# Patient Record
Sex: Female | Born: 1965 | Race: White | Hispanic: No | Marital: Single | Smoking: Never smoker
Health system: Southern US, Community
[De-identification: ages and names within clinical notes are randomized; demographics above are authoritative.]

## PROBLEM LIST (undated history)

## (undated) ENCOUNTER — Ambulatory Visit: Admission: EM | Payer: 59 | Source: Home / Self Care

## (undated) DIAGNOSIS — F419 Anxiety disorder, unspecified: Secondary | ICD-10-CM

## (undated) DIAGNOSIS — F32A Depression, unspecified: Secondary | ICD-10-CM

## (undated) DIAGNOSIS — E079 Disorder of thyroid, unspecified: Secondary | ICD-10-CM

## (undated) DIAGNOSIS — I10 Essential (primary) hypertension: Secondary | ICD-10-CM

## (undated) HISTORY — PX: CHOLECYSTECTOMY: SHX55

## (undated) HISTORY — PX: ABDOMINAL HYSTERECTOMY: SHX81

---

## 2020-01-19 ENCOUNTER — Other Ambulatory Visit: Payer: Self-pay

## 2020-01-19 ENCOUNTER — Encounter: Payer: Self-pay | Admitting: Emergency Medicine

## 2020-01-19 ENCOUNTER — Ambulatory Visit
Admission: EM | Admit: 2020-01-19 | Discharge: 2020-01-19 | Disposition: A | Discharge status: Home / Self Care | Payer: BLUE CROSS/BLUE SHIELD | Attending: Physician Assistant | Admitting: Physician Assistant

## 2020-01-19 DIAGNOSIS — R519 Headache, unspecified: Secondary | ICD-10-CM | POA: Diagnosis not present

## 2020-01-19 DIAGNOSIS — Z79899 Other long term (current) drug therapy: Secondary | ICD-10-CM | POA: Diagnosis not present

## 2020-01-19 DIAGNOSIS — I10 Essential (primary) hypertension: Secondary | ICD-10-CM | POA: Diagnosis not present

## 2020-01-19 DIAGNOSIS — Z20822 Contact with and (suspected) exposure to covid-19: Secondary | ICD-10-CM | POA: Diagnosis not present

## 2020-01-19 DIAGNOSIS — R059 Cough, unspecified: Secondary | ICD-10-CM | POA: Diagnosis present

## 2020-01-19 DIAGNOSIS — Z7901 Long term (current) use of anticoagulants: Secondary | ICD-10-CM | POA: Insufficient documentation

## 2020-01-19 DIAGNOSIS — R0981 Nasal congestion: Secondary | ICD-10-CM | POA: Insufficient documentation

## 2020-01-19 DIAGNOSIS — B349 Viral infection, unspecified: Secondary | ICD-10-CM | POA: Insufficient documentation

## 2020-01-19 DIAGNOSIS — E079 Disorder of thyroid, unspecified: Secondary | ICD-10-CM | POA: Insufficient documentation

## 2020-01-19 HISTORY — DX: Essential (primary) hypertension: I10

## 2020-01-19 HISTORY — DX: Disorder of thyroid, unspecified: E07.9

## 2020-01-19 LAB — SARS CORONAVIRUS 2 (TAT 6-24 HRS): SARS Coronavirus 2: NEGATIVE

## 2020-01-19 MED ORDER — FLUTICASONE PROPIONATE 50 MCG/ACT NA SUSP: 1.0000 | Freq: Two times a day (BID) | NASAL | 0 refills | Status: DC

## 2020-01-19 MED ORDER — PSEUDOEPH-BROMPHEN-DM 30-2-10 MG/5ML PO SYRP: 5.0000 mL | ORAL_SOLUTION | Freq: Four times a day (QID) | ORAL | 0 refills | Status: AC | PRN

## 2020-01-19 NOTE — Discharge Instructions (Addendum)

## 2020-01-19 NOTE — ED Provider Notes (Signed)
[facility]    CSN: [id number] Arrival date & time: 01/19/20  4235      History   Chief Complaint Chief Complaint  Patient presents with  . Cough    HPI Linda Barnes is a 54 y.o. female presents for 3 day history of nasal drainage, sinus pressure and cough.  She says it feels like there has been a "pop off."  Patient admits to headaches.  She says the cough is usually dry.  Denies any associated fever, fatigue, body aches, chest pain or breathing difficulty.  No known Covid exposure.  Fully vaccinated for COVID 19.  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  No history of cardiopulmonary disease.  She says that she has not tried any over-the-counter medications for symptoms.  No other concerns.  HPI  Past Medical History:  Diagnosis Date  . Hypertension   . Thyroid disease     There are no problems to display for this patient.   Past Surgical History:  Procedure Laterality Date  . ABDOMINAL HYSTERECTOMY    . CHOLECYSTECTOMY      OB History   No obstetric history on file.      Home Medications    Prior to Admission medications   Medication Sig Start Date End Date Taking? Authorizing Provider  buPROPion (ZYBAN) 150 MG 12 hr tablet Take by mouth. 05/14/18  Yes [provider]  citalopram (CELEXA) 20 MG tablet Take by mouth. 05/26/18  Yes [provider]  gabapentin (NEURONTIN) 300 MG capsule Take by mouth. 01/24/19  Yes [provider]  brompheniramine-pseudoephedrine-DM 30-2-10 MG/5ML syrup Take 5 mLs by mouth 4 (four) times daily as needed for up to 7 days. 01/19/20 01/26/20  Eusebio Friendly B, PA-C  fluticasone (FLONASE) 50 MCG/ACT nasal spray Place 1 spray into both nostrils 2 (two) times daily for 7 days. 01/19/20 01/26/20  Eusebio Friendly B, PA-C  hydrochlorothiazide (HYDRODIURIL) 25 MG tablet Take by mouth.    [provider]  Levothyroxine Sodium 75 MCG  CAPS Take by mouth.    [provider]  losartan (COZAAR) 100 MG tablet Take by mouth.    [provider]  montelukast (SINGULAIR) 10 MG tablet Take 1 tablet by mouth daily.    [provider]  omeprazole (PRILOSEC) 40 MG capsule Take by mouth.    [provider]    Family History Family History  Problem Relation Age of Onset  . Cancer Mother     Social History Social History   Tobacco Use  . Smoking status: Never Smoker  . Smokeless tobacco: Never Used  Vaping Use  . Vaping Use: Never assessed  Substance Use Topics  . Alcohol use: Not Currently  . Drug use: Not Currently     Allergies   Nsaids   Review of Systems Review of Systems  Constitutional: Negative for chills, diaphoresis, fatigue and fever.  HENT: Positive for congestion, postnasal drip, rhinorrhea, sinus pressure and sore throat. Negative for ear pain and sinus pain.   Respiratory: Positive for cough. Negative for shortness of breath.   Gastrointestinal: Negative for abdominal pain, nausea and vomiting.  Musculoskeletal: Negative for arthralgias and myalgias.  Skin: Negative for rash.  Neurological: Positive for headaches. Negative for weakness.  Hematological: Negative for adenopathy.     Physical Exam Triage Vital Signs ED Triage Vitals  Enc Vitals Group     BP 01/19/20 0934 131/73  Pulse Rate 01/19/20 0934 65     Resp 01/19/20 0934 18     Temp 01/19/20 0934 98.6 F (37 C)     Temp Source 01/19/20 0934 Oral     SpO2 01/19/20 0934 100 %     Weight 01/19/20 0930 165 lb (74.8 kg)     Height 01/19/20 0930 5\' 6"  (1.676 m)     Head Circumference --      Peak Flow --      Pain Score 01/19/20 0930 6     Pain Loc --      Pain Edu? --      Excl. in GC? --    No data found.  Updated Vital Signs BP 131/73 (BP Location: Left Arm)   Pulse 65   Temp 98.6 F (37 C) (Oral)   Resp 18   Ht 5\' 6"  (1.676 m)   Wt 165 lb (74.8 kg)   SpO2 100%   BMI 26.63 kg/m     Physical Exam Vitals and nursing note reviewed.  Constitutional:      General: She is not in acute distress.    Appearance: Normal appearance. She is not ill-appearing or toxic-appearing.  HENT:     Head: Normocephalic and atraumatic.     Right Ear: Tympanic membrane, ear canal and external ear normal.     Left Ear: Tympanic membrane, ear canal and external ear normal.     Nose: Congestion present.     Mouth/Throat:     Mouth: Mucous membranes are moist.     Pharynx: Oropharynx is clear. Posterior oropharyngeal erythema present.  Eyes:     General: No scleral icterus.       Right eye: No discharge.        Left eye: No discharge.     Conjunctiva/sclera: Conjunctivae normal.  Cardiovascular:     Rate and Rhythm: Normal rate and regular rhythm.     Heart sounds: Normal heart sounds.  Pulmonary:     Effort: Pulmonary effort is normal. No respiratory distress.     Breath sounds: Normal breath sounds.  Musculoskeletal:     Cervical back: Neck supple.  Skin:    General: Skin is dry.  Neurological:     General: No focal deficit present.     Mental Status: She is alert. Mental status is at baseline.     Motor: No weakness.     Gait: Gait normal.  Psychiatric:        Mood and Affect: Mood normal.        Behavior: Behavior normal.        Thought Content: Thought content normal.      UC Treatments / Results  Labs (all labs ordered are listed, but only abnormal results are displayed) Labs Reviewed  SARS CORONAVIRUS 2 (TAT 6-24 HRS)    EKG   Radiology No results found.  Procedures Procedures (including critical care time)  Medications Ordered in UC Medications - No data to display  Initial Impression / Assessment and Plan / UC Course  I have reviewed the triage vital signs and the nursing notes.  Pertinent labs & imaging results that were available during my care of the patient were reviewed by me and considered in my medical decision making (see chart for  details).   Covid testing performed.  CDC guidelines, isolation protocol and ED precaution discussed if positive.  Prescribed Bromfed and Flonase for symptoms.  Advise rest and increasing fluids.  Follow-up as needed with  our department for any new or worsening symptoms or if not feeling better over the next week  Final Clinical Impressions(s) / UC Diagnoses   Final diagnoses:  Viral illness  Cough  Sinus congestion     Discharge Instructions     URI/COLD SYMPTOMS: Your exam today is consistent with a viral illness. Antibiotics are not indicated at this time. Use medications as directed, including cough syrup, nasal saline, and decongestants. Your symptoms should improve over the next few days and resolve within 7-10 days. Increase rest and fluids. F/u if symptoms worsen or predominate such as sore throat, ear pain, productive cough, shortness of breath, or if you develop high fevers or worsening fatigue over the next several days.    You have received COVID testing today either for positive exposure, concerning symptoms that could be related to COVID infection, screening purposes, or re-testing after confirmed positive.  Your test obtained today checks for active viral infection in the last 1-2 weeks. If your test is negative now, you can still test positive later. So, if you do develop symptoms you should either get re-tested and/or isolate x 10 days. Please follow CDC guidelines.  While Rapid antigen tests come back in 15-20 minutes, send out PCR/molecular test results typically come back within 24 hours. In the mean time, if you are symptomatic, assume this could be a positive test and treat/monitor yourself as if you do have COVID.   We will call with test results. Please download the MyChart app and set up a profile to access test results.   If symptomatic, go home and rest. Push fluids. Take Tylenol as needed for discomfort. Gargle warm salt water. Throat lozenges. Take Mucinex DM or  Robitussin for cough. Humidifier in bedroom to ease coughing. Warm showers. Also review the COVID handout for more information.  COVID-19 INFECTION: The incubation period of COVID-19 is approximately 14 days after exposure, with most symptoms developing in roughly 4-5 days. Symptoms may range in severity from mild to critically severe. Roughly 80% of those infected will have mild symptoms. People of any age may become infected with COVID-19 and have the ability to transmit the virus. The most common symptoms include: fever, fatigue, cough, body aches, headaches, sore throat, nasal congestion, shortness of breath, nausea, vomiting, diarrhea, changes in smell and/or taste.    COURSE OF ILLNESS Some patients may begin with mild disease which can progress quickly into critical symptoms. If your symptoms are worsening please call ahead to the Emergency Department and proceed there for further treatment. Recovery time appears to be roughly 1-2 weeks for mild symptoms and 3-6 weeks for severe disease.   GO IMMEDIATELY TO ER FOR FEVER YOU ARE UNABLE TO GET DOWN WITH TYLENOL, BREATHING PROBLEMS, CHEST PAIN, FATIGUE, LETHARGY, INABILITY TO EAT OR DRINK, ETC  QUARANTINE AND ISOLATION: To help decrease the spread of COVID-19 please remain isolated if you have COVID infection or are highly suspected to have COVID infection. This means -stay home and isolate to one room in the home if you live with others. Do not share a bed or bathroom with others while ill, sanitize and wipe down all countertops and keep common areas clean and disinfected. You may discontinue isolation if you have a mild case and are asymptomatic 10 days after symptom onset as long as you have been fever free >24 hours without having to take Motrin or Tylenol. If your case is more severe (meaning you develop pneumonia or are admitted in the hospital), you  may have to isolate longer.   If you have been in close contact (within 6 feet) of someone  diagnosed with COVID 19, you are advised to quarantine in your home for 14 days as symptoms can develop anywhere from 2-14 days after exposure to the virus. If you develop symptoms, you  must isolate.  Most current guidelines for COVID after exposure -isolate 10 days if you ARE NOT tested for COVID as long as symptoms do not develop -isolate 7 days if you are tested and remain asymptomatic -You do not necessarily need to be tested for COVID if you have + exposure and        develop   symptoms. Just isolate at home x10 days from symptom onset During this global pandemic, CDC advises to practice social distancing, try to stay at least 10ft away from others at all times. Wear a face covering. Wash and sanitize your hands regularly and avoid going anywhere that is not necessary.  KEEP IN MIND THAT THE COVID TEST IS NOT 100% ACCURATE AND YOU SHOULD STILL DO EVERYTHING TO PREVENT POTENTIAL SPREAD OF VIRUS TO OTHERS (WEAR MASK, WEAR GLOVES, WASH HANDS AND SANITIZE REGULARLY). IF INITIAL TEST IS NEGATIVE, THIS MAY NOT MEAN YOU ARE DEFINITELY NEGATIVE. MOST ACCURATE TESTING IS DONE 5-7 DAYS AFTER EXPOSURE.   It is not advised by CDC to get re-tested after receiving a positive COVID test since you can still test positive for weeks to months after you have already cleared the virus.   *If you have not been vaccinated for COVID, I strongly suggest you consider getting vaccinated as long as there are no contraindications.      ED Prescriptions    Medication Sig Dispense Auth. Provider   brompheniramine-pseudoephedrine-DM 30-2-10 MG/5ML syrup Take 5 mLs by mouth 4 (four) times daily as needed for up to 7 days. 120 mL Eusebio Friendly B, PA-C   fluticasone (FLONASE) 50 MCG/ACT nasal spray Place 1 spray into both nostrils 2 (two) times daily for 7 days. 1 g Shirlee Latch, PA-C     PDMP not reviewed this encounter.   Shirlee Latch, PA-C 01/19/20 1052

## 2020-01-19 NOTE — ED Triage Notes (Signed)
Pt c/o cough, sinus pressure, post nasal drainage, and nasal congestion. Started about 3 days. She has had covid vaccines.

## 2020-05-10 ENCOUNTER — Ambulatory Visit (INDEPENDENT_AMBULATORY_CARE_PROVIDER_SITE_OTHER): Payer: 59

## 2020-05-10 ENCOUNTER — Encounter: Payer: Self-pay | Admitting: Emergency Medicine

## 2020-05-10 ENCOUNTER — Ambulatory Visit
Admission: EM | Admit: 2020-05-10 | Discharge: 2020-05-10 | Disposition: A | Discharge status: Home / Self Care | Payer: 59 | Attending: Physician Assistant | Admitting: Physician Assistant

## 2020-05-10 ENCOUNTER — Other Ambulatory Visit: Payer: Self-pay

## 2020-05-10 DIAGNOSIS — R197 Diarrhea, unspecified: Secondary | ICD-10-CM | POA: Diagnosis present

## 2020-05-10 DIAGNOSIS — R1110 Vomiting, unspecified: Secondary | ICD-10-CM

## 2020-05-10 DIAGNOSIS — R059 Cough, unspecified: Secondary | ICD-10-CM | POA: Diagnosis present

## 2020-05-10 DIAGNOSIS — E876 Hypokalemia: Secondary | ICD-10-CM | POA: Diagnosis present

## 2020-05-10 DIAGNOSIS — U071 COVID-19: Secondary | ICD-10-CM

## 2020-05-10 DIAGNOSIS — R112 Nausea with vomiting, unspecified: Secondary | ICD-10-CM | POA: Insufficient documentation

## 2020-05-10 DIAGNOSIS — Z8616 Personal history of COVID-19: Secondary | ICD-10-CM | POA: Diagnosis not present

## 2020-05-10 HISTORY — DX: Anxiety disorder, unspecified: F41.9

## 2020-05-10 HISTORY — DX: Depression, unspecified: F32.A

## 2020-05-10 LAB — URINALYSIS, COMPLETE (UACMP) WITH MICROSCOPIC
Glucose, UA: NEGATIVE mg/dL
Leukocytes,Ua: NEGATIVE
Nitrite: NEGATIVE
Specific Gravity, Urine: 1.02 (ref 1.005–1.030)
pH: 6 (ref 5.0–8.0)

## 2020-05-10 LAB — CBC WITH DIFFERENTIAL/PLATELET
Abs Immature Granulocytes: 0.02 10*3/uL (ref 0.00–0.07)
Basophils Absolute: 0 10*3/uL (ref 0.0–0.1)
Basophils Relative: 0 %
Eosinophils Absolute: 0 10*3/uL (ref 0.0–0.5)
Eosinophils Relative: 1 %
HCT: 37.2 % (ref 36.0–46.0)
Hemoglobin: 13 g/dL (ref 12.0–15.0)
Immature Granulocytes: 0 %
Lymphocytes Relative: 35 %
Lymphs Abs: 1.6 10*3/uL (ref 0.7–4.0)
MCH: 29.2 pg (ref 26.0–34.0)
MCHC: 34.9 g/dL (ref 30.0–36.0)
MCV: 83.6 fL (ref 80.0–100.0)
Monocytes Absolute: 0.3 10*3/uL (ref 0.1–1.0)
Monocytes Relative: 8 %
Neutro Abs: 2.5 10*3/uL (ref 1.7–7.7)
Neutrophils Relative %: 56 %
Platelets: 266 10*3/uL (ref 150–400)
RBC: 4.45 MIL/uL (ref 3.87–5.11)
RDW: 12.6 % (ref 11.5–15.5)
WBC: 4.5 10*3/uL (ref 4.0–10.5)
nRBC: 0 % (ref 0.0–0.2)

## 2020-05-10 LAB — COMPREHENSIVE METABOLIC PANEL
ALT: 19 U/L (ref 0–44)
AST: 23 U/L (ref 15–41)
Albumin: 4 g/dL (ref 3.5–5.0)
Alkaline Phosphatase: 64 U/L (ref 38–126)
Anion gap: 7 (ref 5–15)
BUN: 14 mg/dL (ref 6–20)
CO2: 29 mmol/L (ref 22–32)
Calcium: 8.6 mg/dL — ABNORMAL LOW (ref 8.9–10.3)
Chloride: 97 mmol/L — ABNORMAL LOW (ref 98–111)
Creatinine, Ser: 1.11 mg/dL — ABNORMAL HIGH (ref 0.44–1.00)
GFR, Estimated: 59 mL/min — ABNORMAL LOW (ref >60)
Glucose, Bld: 93 mg/dL (ref 70–99)
Potassium: 2.7 mmol/L — CL (ref 3.5–5.1)
Sodium: 133 mmol/L — ABNORMAL LOW (ref 135–145)
Total Bilirubin: 0.9 mg/dL (ref 0.3–1.2)
Total Protein: 7.2 g/dL (ref 6.5–8.1)

## 2020-05-10 LAB — LIPASE, BLOOD: Lipase: 33 U/L (ref 11–51)

## 2020-05-10 NOTE — ED Notes (Signed)
Patient is being discharged from the Urgent Care and sent to the Emergency Department via POV . Per Eusebio Friendly, PA, patient is in need of higher level of care due to hypokalemia. Patient is aware and verbalizes understanding of plan of care.  Vitals:   05/10/20 0912  BP: 134/75  Pulse: 74  Resp: 18  Temp: 98.1 F (36.7 C)  SpO2: 100%

## 2020-05-10 NOTE — ED Triage Notes (Signed)
Patient in today c/o emesis and diarrhea x 1 day. Patient tested positive for covid 04/29/20. Patient felt feverish yesterday, but didn't take her temperature. Patient has not taken any OTC medications.

## 2020-05-10 NOTE — ED Provider Notes (Signed)
[facility]    CSN: [id number] Arrival date & time: 05/10/20  0900      History   Chief Complaint Chief Complaint  Patient presents with  . Emesis  . Diarrhea    HPI Linda Barnes is a 55 y.o. female presenting for nausea/vomiting and diarrhea since yesterday. She did test positive for COVID 19 on 04/28/2020 at [facility]. She complained for 3 day history of cough and congestion at that time.  Patient states that she was treated for possible pneumonia with Augmentin and prednisone and finish the course of medications recently.  She says that she has had a continued cough but it has improved.  Denies any pain in her chest or breathing difficulty.  Admits to mild sore throat and runny nose as well.  She says that the nausea and vomiting as well as diarrhea symptoms are new over the past day.  She says she has had diarrhea every hour and states that she has vomited anytime she is try to eat or drink anything.  Patient also admits to abdominal cramping throughout which she says is moderate.  Denies any urinary symptoms including painful urination or frequency.  No bloody urine.  No black stools or blood in the stools.  Patient's past medical history significant for hypertension and thyroid problems.  She does have a surgical past history of abdominal hysterectomy and cholecystectomy.  Patient not taking any over-the-counter medication for symptoms. 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  Patient has no other complaints or concerns.  HPI  Past Medical History:  Diagnosis Date  . Anxiety   . Depression   . Hypertension   . Thyroid disease     There are no problems to display for this patient.   Past Surgical History:  Procedure Laterality Date  . ABDOMINAL HYSTERECTOMY    . CHOLECYSTECTOMY      OB History   No obstetric history on file.      Home  Medications    Prior to Admission medications   Medication Sig Start Date End Date Taking? Authorizing Provider  buPROPion (WELLBUTRIN XL) 300 MG 24 hr tablet Take 300 mg by mouth daily. 02/11/20  Yes [provider]  citalopram (CELEXA) 20 MG tablet Take by mouth. 05/26/18  Yes [provider]  fluticasone (FLONASE) 50 MCG/ACT nasal spray Place 1 spray into both nostrils 2 (two) times daily for 7 days. 01/19/20 01/26/20 Yes Eusebio Friendly B, PA-C  gabapentin (NEURONTIN) 300 MG capsule Take by mouth. 01/24/19  Yes [provider]  hydrochlorothiazide (HYDRODIURIL) 25 MG tablet Take by mouth.   Yes [provider]  Levothyroxine Sodium 75 MCG CAPS Take by mouth.   Yes [provider]  losartan (COZAAR) 100 MG tablet Take by mouth.   Yes [provider]  montelukast (SINGULAIR) 10 MG tablet Take 1 tablet by mouth daily.   Yes [provider]  omeprazole (PRILOSEC) 40 MG capsule Take by mouth.   Yes [provider]    Family History Family History  Problem Relation Age of Onset  . Cancer Mother   . Hypertension Mother   . Thyroid disease Mother   . Pulmonary fibrosis Father   . Hypertension Father   . Atrial fibrillation Father     Social History Social History   Tobacco Use  . Smoking status: Never Smoker  . Smokeless  tobacco: Never Used  Vaping Use  . Vaping Use: Never used  Substance Use Topics  . Alcohol use: Never  . Drug use: Not Currently     Allergies   Nsaids and Other   Review of Systems Review of Systems  Constitutional: Positive for fatigue. Negative for chills, diaphoresis and fever.  HENT: Positive for congestion and sore throat. Negative for ear pain, rhinorrhea, sinus pressure and sinus pain.   Respiratory: Positive for cough. Negative for shortness of breath and wheezing.   Cardiovascular: Negative for chest pain.  Gastrointestinal: Positive for abdominal pain, diarrhea, nausea and  vomiting.  Musculoskeletal: Positive for myalgias. Negative for arthralgias.  Skin: Negative for rash.  Neurological: Negative for weakness and headaches.  Hematological: Negative for adenopathy.     Physical Exam Triage Vital Signs ED Triage Vitals  Enc Vitals Group     BP      Pulse      Resp      Temp      Temp src      SpO2      Weight      Height      Head Circumference      Peak Flow      Pain Score      Pain Loc      Pain Edu?      Excl. in GC?    No data found.  Updated Vital Signs BP 134/75 (BP Location: Left Arm)   Pulse 74   Temp 98.1 F (36.7 C) (Oral)   Resp 18   Ht 5\' 4"  (1.626 m)   Wt 165 lb (74.8 kg)   SpO2 100%   BMI 28.32 kg/m       Physical Exam Vitals and nursing note reviewed.  Constitutional:      General: She is not in acute distress.    Appearance: Normal appearance. She is not ill-appearing or toxic-appearing.  HENT:     Head: Normocephalic and atraumatic.     Nose: Congestion and rhinorrhea (trace clear drainage) present.     Mouth/Throat:     Mouth: Mucous membranes are moist.     Pharynx: Oropharynx is clear.  Eyes:     General: No scleral icterus.       Right eye: No discharge.        Left eye: No discharge.     Conjunctiva/sclera: Conjunctivae normal.  Cardiovascular:     Rate and Rhythm: Normal rate and regular rhythm.     Heart sounds: Normal heart sounds.  Pulmonary:     Effort: Pulmonary effort is normal. No respiratory distress.     Breath sounds: Normal breath sounds.  Abdominal:     Palpations: Abdomen is soft.     Tenderness: There is generalized abdominal tenderness. There is no right CVA tenderness, left CVA tenderness, guarding or rebound.  Musculoskeletal:     Cervical back: Neck supple.  Skin:    General: Skin is dry.  Neurological:     General: No focal deficit present.     Mental Status: She is alert. Mental status is at baseline.     Motor: No weakness.     Gait: Gait normal.  Psychiatric:         Mood and Affect: Mood normal.        Behavior: Behavior normal.        Thought Content: Thought content normal.      UC Treatments / Results  Labs (all labs ordered  are listed, but only abnormal results are displayed) Labs Reviewed  CBC WITH DIFFERENTIAL/PLATELET  COMPREHENSIVE METABOLIC PANEL  URINALYSIS, COMPLETE (UACMP) WITH MICROSCOPIC  LIPASE, BLOOD    EKG   Radiology DG Chest 2 View  Result Date: 05/10/2020 CLINICAL DATA:  Cough, vomiting.  COVID 2 weeks ago. EXAM: CHEST - 2 VIEW COMPARISON:  None. FINDINGS: The heart size and mediastinal contours are within normal limits. Both lungs are clear. No visible pleural effusions or pneumothorax. No acute osseous abnormality. Cholecystectomy clips. IMPRESSION: No active cardiopulmonary disease. Electronically Signed   By: Feliberto Harts MD   On: 05/10/2020 10:25    Procedures Procedures (including critical care time)  Medications Ordered in UC Medications - No data to display  Initial Impression / Assessment and Plan / UC Course  I have reviewed the triage vital signs and the nursing notes.  Pertinent labs & imaging results that were available during my care of the patient were reviewed by me and considered in my medical decision making (see chart for details).    55 y/o female with positive COVID test 12 days ago and symptom start 15 days ago presents for onset of n/v/d yesterday and continued cough.   In the clinic all VSS and patient in NAD.  On patient exam she does have mild clear nasal drainage and diffuse abdominal tenderness to palpation.  The rest the exam is within normal limits including that her chest is clear to auscultation heart regular rate and rhythm.  Due to continued symptoms of cough, obtaining chest x-ray at this time.  Due to complaint of abdominal pain diffusely containing CBC, CMP, lipase and UA.  CXR independently reviewed by me and overread confirms normal report.  No evidence of acute  cardiopulmonary disease or abnormality.  CBC normal. CMP significant for CRITICALLY LOW POTASSIUM AT 2.7.  Sodium slightly low 133, creatinine elevated at 1.11 and GFR decreased to 59.  Urinalysis shows trace blood, trace ketones and trace protein.  Discussed results with patient and advised following up in the ED at this time for likely IV fluids and potassium replacement.   Patient has agreed to go to [facility] ED in [facility] for hypokalemia.  Final Clinical Impressions(s) / UC Diagnoses   Final diagnoses:  Hypokalemia  Nausea vomiting and diarrhea  Personal history of COVID-19  Cough     Discharge Instructions     Your labs are significant for critically low potassium. This is likely due to losses from vomiting and diarrhea. The potassium being this low is a medical emergency so you should go to the ED at this time for IV fluids and likely potassium replacement.  Chest x-ray is normal.   You have been advised to follow up immediately in the emergency department for concerning signs.symptoms. If you declined EMS transport, please have a family member take you directly to the ED at this time. Do not delay. Based on concerns about condition, if you do not follow up in th e ED, you may risk poor outcomes including worsening of condition, delayed treatment and potentially life threatening issues. If you have declined to go to the ED at this time, you should call your PCP immediately to set up a follow up appointment.  Go to ED for red flag symptoms, including; fevers you cannot reduce with Tylenol/Motrin, severe headaches, vision changes, numbness/weakness in part of the body, lethargy, confusion, intractable vomiting, severe dehydration, chest pain, breathing difficulty, severe persistent abdominal or pelvic pain, signs of severe infection (  increased redness, swelling of an area), feeling faint or passing out, dizziness, etc. You should especially go to the ED for sudden acute worsening of  condition if you do not elect to go at this time.     ED Prescriptions    None     PDMP not reviewed this encounter.   Shirlee Latch, PA-C 05/10/20 1125

## 2020-05-10 NOTE — Discharge Instructions (Addendum)
Your labs are significant for critically low potassium. This is likely due to losses from vomiting and diarrhea. The potassium being this low is a medical emergency so you should go to the ED at this time for IV fluids and likely potassium replacement.  Chest x-ray is normal.   You have been advised to follow up immediately in the emergency department for concerning signs.symptoms. If you declined EMS transport, please have a family member take you directly to the ED at this time. Do not delay. Based on concerns about condition, if you do not follow up in th e ED, you may risk poor outcomes including worsening of condition, delayed treatment and potentially life threatening issues. If you have declined to go to the ED at this time, you should call your PCP immediately to set up a follow up appointment.  Go to ED for red flag symptoms, including; fevers you cannot reduce with Tylenol/Motrin, severe headaches, vision changes, numbness/weakness in part of the body, lethargy, confusion, intractable vomiting, severe dehydration, chest pain, breathing difficulty, severe persistent abdominal or pelvic pain, signs of severe infection (increased redness, swelling of an area), feeling faint or passing out, dizziness, etc. You should especially go to the ED for sudden acute worsening of condition if you do not elect to go at this time.

## 2020-07-09 ENCOUNTER — Other Ambulatory Visit: Payer: Self-pay

## 2020-07-09 ENCOUNTER — Ambulatory Visit
Admission: EM | Admit: 2020-07-09 | Discharge: 2020-07-09 | Disposition: A | Discharge status: Home / Self Care | Payer: 59 | Attending: Physician Assistant | Admitting: Physician Assistant

## 2020-07-09 ENCOUNTER — Encounter: Payer: Self-pay | Admitting: Emergency Medicine

## 2020-07-09 DIAGNOSIS — L03012 Cellulitis of left finger: Secondary | ICD-10-CM

## 2020-07-09 DIAGNOSIS — M79645 Pain in left finger(s): Secondary | ICD-10-CM

## 2020-07-09 MED ORDER — MUPIROCIN 2 % EX OINT: TOPICAL_OINTMENT | CUTANEOUS | 0 refills | Status: AC

## 2020-07-09 MED ORDER — DOXYCYCLINE HYCLATE 100 MG PO CAPS: 100.0000 mg | ORAL_CAPSULE | Freq: Two times a day (BID) | ORAL | 0 refills | Status: AC

## 2020-07-09 NOTE — ED Provider Notes (Signed)
[facility]    CSN: [id number] Arrival date & time: 07/09/20  1358      History   Chief Complaint Chief Complaint  Patient presents with  . Hand Pain    Left thumb    HPI Linda Barnes is a 55 y.o. female presenting for approximately 1 week history of pain of the left thumb.  Patient says that about a week ago some pus came out from around her cuticle region.  She says she has had increased swelling and pain since and no improvement in the situation.  She says she has cleaned the finger with hydrogen peroxide and applied Neosporin without relief.  She denies any fevers and has not had any continued pustular drainage.  She says it has bled some.  She never injured the finger.  Denies similar problem the past.  No other concerns or complaints.  HPI  Past Medical History:  Diagnosis Date  . Anxiety   . Depression   . Hypertension   . Thyroid disease     There are no problems to display for this patient.   Past Surgical History:  Procedure Laterality Date  . ABDOMINAL HYSTERECTOMY    . CHOLECYSTECTOMY      OB History   No obstetric history on file.      Home Medications    Prior to Admission medications   Medication Sig Start Date End Date Taking? Authorizing Provider  buPROPion (WELLBUTRIN XL) 300 MG 24 hr tablet Take 300 mg by mouth daily. 02/11/20  Yes [provider]  citalopram (CELEXA) 20 MG tablet Take by mouth. 05/26/18  Yes [provider]  doxycycline (VIBRAMYCIN) 100 MG capsule Take 1 capsule (100 mg total) by mouth 2 (two) times daily for 7 days. 07/09/20 07/16/20 Yes Eusebio Friendly B, PA-C  fluticasone (FLONASE) 50 MCG/ACT nasal spray Place 1 spray into both nostrils 2 (two) times daily for 7 days. 01/19/20 01/26/20 Yes Eusebio Friendly B, PA-C  gabapentin (NEURONTIN) 300 MG capsule Take by mouth. 01/24/19  Yes [provider]  hydrochlorothiazide (HYDRODIURIL) 25 MG tablet Take by mouth.   Yes [provider]   Levothyroxine Sodium 75 MCG CAPS Take by mouth.   Yes [provider]  losartan (COZAAR) 100 MG tablet Take by mouth.   Yes [provider]  montelukast (SINGULAIR) 10 MG tablet Take 1 tablet by mouth daily.   Yes [provider]  mupirocin ointment (BACTROBAN) 2 % Apply to left thumb twice daily 07/09/20 07/16/20 Yes Shirlee Latch, PA-C  omeprazole (PRILOSEC) 40 MG capsule Take by mouth.   Yes [provider]    Family History Family History  Problem Relation Age of Onset  . Cancer Mother   . Hypertension Mother   . Thyroid disease Mother   . Pulmonary fibrosis Father   . Hypertension Father   . Atrial fibrillation Father     Social History Social History   Tobacco Use  . Smoking status: Never Smoker  . Smokeless tobacco: Never Used  Vaping Use  . Vaping Use: Never used  Substance Use Topics  . Alcohol use: Never  . Drug use: Not Currently     Allergies   Nsaids and Other   Review of Systems Review of Systems  Constitutional: Negative for fatigue and fever.  Musculoskeletal: Positive for joint swelling. Negative for arthralgias.  Skin: Positive for color change and wound.  Neurological: Negative for weakness and numbness.     Physical Exam Triage Vital  Signs ED Triage Vitals  Enc Vitals Group     BP 07/09/20 1414 (!) 137/95     Pulse Rate 07/09/20 1414 74     Resp 07/09/20 1414 18     Temp 07/09/20 1414 98.4 F (36.9 C)     Temp Source 07/09/20 1414 Oral     SpO2 07/09/20 1414 100 %     Weight 07/09/20 1412 164 lb 14.5 oz (74.8 kg)     Height 07/09/20 1412 5\' 4"  (1.626 m)     Head Circumference --      Peak Flow --      Pain Score 07/09/20 1412 6     Pain Loc --      Pain Edu? --      Excl. in GC? --    No data found.  Updated Vital Signs BP (!) 137/95 (BP Location: Left Arm)   Pulse 74   Temp 98.4 F (36.9 C) (Oral)   Resp 18   Ht 5\' 4"  (1.626 m)   Wt 164 lb 14.5 oz (74.8 kg)   SpO2 100%   BMI 28.31  kg/m      Physical Exam Vitals and nursing note reviewed.  Constitutional:      General: She is not in acute distress.    Appearance: Normal appearance. She is not ill-appearing or toxic-appearing.  HENT:     Head: Normocephalic and atraumatic.     Mouth/Throat:     Pharynx: Oropharynx is clear.  Eyes:     General: No scleral icterus.       Right eye: No discharge.        Left eye: No discharge.     Conjunctiva/sclera: Conjunctivae normal.  Cardiovascular:     Rate and Rhythm: Normal rate and regular rhythm.     Pulses: Normal pulses.     Heart sounds: Normal heart sounds.  Pulmonary:     Effort: Pulmonary effort is normal. No respiratory distress.  Musculoskeletal:     Cervical back: Neck supple.  Skin:    General: Skin is dry.     Comments: Left thumb: There is moderate swelling and erythema of the cuticle.  Increased swelling of the nail fold.  Most tenderness about the nail fold.  Bloody drainage noted.  Good range of motion of finger.  Good strength and sensation.  Neurological:     General: No focal deficit present.     Mental Status: She is alert. Mental status is at baseline.     Motor: No weakness.     Gait: Gait normal.  Psychiatric:        Mood and Affect: Mood normal.        Behavior: Behavior normal.        Thought Content: Thought content normal.      UC Treatments / Results  Labs (all labs ordered are listed, but only abnormal results are displayed) Labs Reviewed - No data to display  EKG   Radiology No results found.  Procedures Incision and Drainage  Date/Time: 07/09/2020 3:01 PM Performed by: , PA-C Authorized by: 09/08/2020, PA-C   Consent:    Consent obtained:  Verbal   Consent given by:  Patient   Risks, benefits, and alternatives were discussed: yes     Risks discussed:  Bleeding, incomplete drainage and pain   Alternatives discussed:  No treatment and delayed treatment Universal protocol:    Patient identity  confirmed:  Verbally with patient and arm  band Location:    Indications for incision and drainage: paronychia.   Size:  6 mm   Location:  Upper extremity   Upper extremity location:  Finger   Finger location:  L thumb Pre-procedure details:    Skin preparation:  Chlorhexidine with alcohol Anesthesia:    Anesthesia method:  Local infiltration   Local anesthetic:  Lidocaine 1% w/o epi Procedure type:    Complexity:  Simple Procedure details:    Incision types:  Stab incision   Incision depth:  Dermal   Drainage:  Bloody   Drainage amount:  Scant   Wound treatment:  Wound left open   Packing materials:  None Post-procedure details:    Procedure completion:  Tolerated well, no immediate complications   (including critical care time)  Medications Ordered in UC Medications - No data to display  Initial Impression / Assessment and Plan / UC Course  I have reviewed the triage vital signs and the nursing notes.  Pertinent labs & imaging results that were available during my care of the patient were reviewed by me and considered in my medical decision making (see chart for details).   55 year old female presenting with paronychia of the left thumb.  I&D performed with bloody drainage.  No purulent discharge.  Cleaned the wound and applied bandage.  Advised supportive care with Tylenol for discomfort.  Advised of warm water soaks and mupirocin ointment.  Send doxycycline to pharmacy.  Advised patient to monitor the area and if symptoms worsen or are not improving in the next 3 days she may need to be seen again or least contact her office.  ED precautions reviewed.   Final Clinical Impressions(s) / UC Diagnoses   Final diagnoses:  Paronychia of left thumb  Finger pain, left     Discharge Instructions     You have an infection of your finger.  Perform warm soaks a couple times a day and then dry the finger well before applying mupirocin ointment.  Do not clean hydrogen peroxide  anymore as it can kill good skin tissue.  He can take Tylenol for pain and discomfort.  I have sent doxycycline to the pharmacy.  Take full course.  Keep an eye on the finger and if symptoms worsen or are not starting to improve after 3 days or you develop a fever, call or return to our clinic.  For any severe acute worsening symptoms go to ED.    ED Prescriptions    Medication Sig Dispense Auth. Provider   doxycycline (VIBRAMYCIN) 100 MG capsule Take 1 capsule (100 mg total) by mouth 2 (two) times daily for 7 days. 14 capsule Eusebio Friendly B, PA-C   mupirocin ointment (BACTROBAN) 2 % Apply to left thumb twice daily 22 g Shirlee Latch, [place]     PDMP not reviewed this encounter.   Shirlee Latch, PA-C 07/09/20 1504

## 2020-07-09 NOTE — ED Triage Notes (Signed)
Pt c/o left thumb pain, swelling. Started about a week ago. No known injury. She has been using peroxide and antibiotic ointment.

## 2020-07-09 NOTE — Discharge Instructions (Signed)
You have an infection of your finger.  Perform warm soaks a couple times a day and then dry the finger well before applying mupirocin ointment.  Do not clean hydrogen peroxide anymore as it can kill good skin tissue.  He can take Tylenol for pain and discomfort.  I have sent doxycycline to the pharmacy.  Take full course.  Keep an eye on the finger and if symptoms worsen or are not starting to improve after 3 days or you develop a fever, call or return to our clinic.  For any severe acute worsening symptoms go to ED.

## 2020-07-26 ENCOUNTER — Ambulatory Visit
Admission: EM | Admit: 2020-07-26 | Discharge: 2020-07-26 | Disposition: A | Discharge status: Home / Self Care | Payer: 59 | Attending: Family Medicine | Admitting: Family Medicine

## 2020-07-26 ENCOUNTER — Other Ambulatory Visit: Payer: Self-pay

## 2020-07-26 DIAGNOSIS — J0100 Acute maxillary sinusitis, unspecified: Secondary | ICD-10-CM | POA: Diagnosis not present

## 2020-07-26 MED ORDER — AMOXICILLIN-POT CLAVULANATE 875-125 MG PO TABS: 1.0000 | ORAL_TABLET | Freq: Two times a day (BID) | ORAL | 0 refills | Status: DC

## 2020-07-26 NOTE — ED Triage Notes (Signed)
Pt c/o sinus congestion for several days. Pt also reports PND and green mucus. Pt denies f/n/v/d, cough or other symptoms. Pt has taken Coricidin with no improvement.

## 2020-07-26 NOTE — ED Provider Notes (Signed)
[facility]    CSN: [id number] Arrival date & time: 07/26/20  0909      History   Chief Complaint Chief Complaint  Patient presents with  . Nasal Congestion   HPI  55 year old female presents with respiratory symptoms.  Patient reports she has had symptoms for several days.  She reports postnasal drip, sinus pressure and congestion.  She reports clear nasal discharge.  She has been taking Coricidin without improvement.  No fever.  Patient reports that she believes she has sinusitis.  She states that she has had a previous sinus infection which resulted in hospitalization and "almost killed me".  No sick contacts.  No other associated symptoms.  No other complaints.  Past Medical History:  Diagnosis Date  . Anxiety   . Depression   . Hypertension   . Thyroid disease    Past Surgical History:  Procedure Laterality Date  . ABDOMINAL HYSTERECTOMY    . CHOLECYSTECTOMY     OB History   No obstetric history on file.    Home Medications    Prior to Admission medications   Medication Sig Start Date End Date Taking? Authorizing Provider  albuterol (VENTOLIN HFA) 108 (90 Base) MCG/ACT inhaler Inhale into the lungs. 04/30/20  Yes [provider]  amoxicillin-clavulanate (AUGMENTIN) 875-125 MG tablet Take 1 tablet by mouth 2 (two) times daily. 07/26/20  Yes Izaiyah Kleinman G, DO  buPROPion (WELLBUTRIN XL) 300 MG 24 hr tablet Take 300 mg by mouth daily. 02/11/20  Yes [provider]  citalopram (CELEXA) 20 MG tablet Take by mouth. 05/26/18  Yes [provider]  cyclobenzaprine (FLEXERIL) 5 MG tablet Take 5-10 mg by mouth every 8 (eight) hours as needed. 07/19/20  Yes [provider]  fluticasone (FLONASE) 50 MCG/ACT nasal spray Place 1 spray into both nostrils 2 (two) times daily for 7 days. 01/19/20 01/26/20 Yes Eusebio Friendly B, PA-C  gabapentin (NEURONTIN) 300 MG capsule Take by mouth. 01/24/19  Yes [provider]  Levothyroxine  Sodium 75 MCG CAPS Take by mouth.   Yes [provider]  losartan-hydrochlorothiazide (HYZAAR) 100-25 MG tablet Take 1 tablet by mouth daily. 07/19/20  Yes [provider]  montelukast (SINGULAIR) 10 MG tablet Take 1 tablet by mouth daily.   Yes [provider]  omeprazole (PRILOSEC) 40 MG capsule Take by mouth.   Yes [provider]  ondansetron (ZOFRAN-ODT) 4 MG disintegrating tablet ondansetron 4 mg disintegrating tablet  DISSOLVE 1 TABLET IN MOUTH EVERY 8 HOURS AS NEEDED FOR NAUSEA UP TO FOR 7 DAYS   Yes [provider]  traMADol (ULTRAM) 50 MG tablet tramadol 50 mg tablet  TAKE 1 TABLET BY MOUTH EVERY 8 HOURS AS NEEDED FOR PAIN UP TO FOR 5 DAYS   Yes [provider]  hydrochlorothiazide (HYDRODIURIL) 25 MG tablet Take by mouth.  07/26/20  [provider]  losartan (COZAAR) 100 MG tablet Take by mouth.  07/26/20  [provider]    Family History Family History  Problem Relation Age of Onset  . Cancer Mother   . Hypertension Mother   . Thyroid disease Mother   . Pulmonary fibrosis Father   . Hypertension Father   . Atrial fibrillation Father     Social History Social History   Tobacco Use  . Smoking status: Never Smoker  . Smokeless tobacco: Never Used  Vaping Use  . Vaping Use: Never used  Substance Use Topics  . Alcohol use: Never  .  Drug use: Not Currently     Allergies   Nsaids and Other   Review of Systems Review of Systems  Constitutional: Negative for fever.  HENT: Positive for congestion, postnasal drip, sinus pressure and sinus pain.    Physical Exam Triage Vital Signs ED Triage Vitals  Enc Vitals Group     BP 07/26/20 1021 108/74     Pulse Rate 07/26/20 1021 85     Resp 07/26/20 1021 18     Temp 07/26/20 1021 97.9 F (36.6 C)     Temp Source 07/26/20 1021 Oral     SpO2 07/26/20 1021 100 %     Weight 07/26/20 1015 140 lb (63.5 kg)     Height 07/26/20 1015 5\' 6"  (1.676 m)      Head Circumference --      Peak Flow --      Pain Score 07/26/20 1015 0     Pain Loc --      Pain Edu? --      Excl. in GC? --    Updated Vital Signs BP 108/74 (BP Location: Left Arm)   Pulse 85   Temp 97.9 F (36.6 C) (Oral)   Resp 18   Ht 5\' 6"  (1.676 m)   Wt 63.5 kg   SpO2 100%   BMI 22.60 kg/m   Visual Acuity Right Eye Distance:   Left Eye Distance:   Bilateral Distance:    Right Eye Near:   Left Eye Near:    Bilateral Near:     Physical Exam Vitals and nursing note reviewed.  Constitutional:      General: She is not in acute distress.    Appearance: Normal appearance. She is not ill-appearing.  HENT:     Head: Normocephalic and atraumatic.     Right Ear: Tympanic membrane normal.     Left Ear: Tympanic membrane normal.     Mouth/Throat:     Comments: Maxillary sinus tenderness to palpation.  Cardiovascular:     Rate and Rhythm: Normal rate and regular rhythm.     Heart sounds: No murmur heard.   Pulmonary:     Effort: Pulmonary effort is normal.     Breath sounds: Normal breath sounds. No wheezing, rhonchi or rales.  Neurological:     Mental Status: She is alert.  Psychiatric:        Mood and Affect: Mood normal.        Behavior: Behavior normal.    UC Treatments / Results  Labs (all labs ordered are listed, but only abnormal results are displayed) Labs Reviewed - No data to display  EKG   Radiology No results found.  Procedures Procedures (including critical care time)  Medications Ordered in UC Medications - No data to display  Initial Impression / Assessment and Plan / UC Course  I have reviewed the triage vital signs and the nursing notes.  Pertinent labs & imaging results that were available during my care of the patient were reviewed by me and considered in my medical decision making (see chart for details).    55 year old female presents with sinusitis. Treating with Augmentin.  Final Clinical Impressions(s) / UC Diagnoses    Final diagnoses:  Acute maxillary sinusitis, recurrence not specified   Discharge Instructions   None    ED Prescriptions    Medication Sig Dispense Auth. Provider   amoxicillin-clavulanate (AUGMENTIN) 875-125 MG tablet Take 1 tablet by mouth 2 (two) times daily. 20 tablet Plover, [place]  G, DO     PDMP not reviewed this encounter.   Tommie Sams, [place] 07/26/20 1130

## 2021-02-03 ENCOUNTER — Other Ambulatory Visit: Payer: Self-pay

## 2021-02-04 ENCOUNTER — Ambulatory Visit
Admission: EM | Admit: 2021-02-04 | Discharge: 2021-02-04 | Disposition: A | Discharge status: Home / Self Care | Payer: 59 | Attending: Internal Medicine | Admitting: Internal Medicine

## 2021-02-04 ENCOUNTER — Ambulatory Visit (INDEPENDENT_AMBULATORY_CARE_PROVIDER_SITE_OTHER): Payer: 59

## 2021-02-04 ENCOUNTER — Encounter: Payer: Self-pay | Admitting: Licensed Clinical Social Worker

## 2021-02-04 DIAGNOSIS — Z20822 Contact with and (suspected) exposure to covid-19: Secondary | ICD-10-CM | POA: Diagnosis not present

## 2021-02-04 DIAGNOSIS — J209 Acute bronchitis, unspecified: Secondary | ICD-10-CM | POA: Diagnosis not present

## 2021-02-04 DIAGNOSIS — R059 Cough, unspecified: Secondary | ICD-10-CM | POA: Diagnosis not present

## 2021-02-04 MED ORDER — GUAIFENESIN ER 600 MG PO TB12: 600.0000 mg | ORAL_TABLET | Freq: Two times a day (BID) | ORAL | 0 refills | Status: AC

## 2021-02-04 MED ORDER — ALBUTEROL SULFATE HFA 108 (90 BASE) MCG/ACT IN AERS: 1.0000 | INHALATION_SPRAY | Freq: Four times a day (QID) | RESPIRATORY_TRACT | 0 refills | Status: AC | PRN

## 2021-02-04 MED ORDER — BENZONATATE 100 MG PO CAPS: 100.0000 mg | ORAL_CAPSULE | Freq: Three times a day (TID) | ORAL | 0 refills | Status: DC | PRN

## 2021-02-04 NOTE — ED Provider Notes (Signed)
[facility]    CSN: [id number] Arrival date & time: 02/04/21  7371      History   Chief Complaint Chief Complaint  Patient presents with   Cough   Otalgia    HPI Linda Barnes is a 55 y.o. female comes to the urgent care with a 4-day history of cough productive of greenish sputum, shortness of breath, nasal congestion and diarrhea of 1 day duration.  Patient's symptoms have been ongoing for the past few days.  Diarrhea is nonbloody and nonmucoid.  No fever or chills.  No sick contacts.  No nausea or vomiting.  No chest pain or chest tightness.   HPI  Past Medical History:  Diagnosis Date   Anxiety    Depression    Hypertension    Thyroid disease     There are no problems to display for this patient.   Past Surgical History:  Procedure Laterality Date   ABDOMINAL HYSTERECTOMY     CHOLECYSTECTOMY      OB History   No obstetric history on file.      Home Medications    Prior to Admission medications   Medication Sig Start Date End Date Taking? Authorizing Provider  albuterol (VENTOLIN HFA) 108 (90 Base) MCG/ACT inhaler Inhale 1 puff into the lungs every 6 (six) hours as needed for wheezing or shortness of breath. 02/04/21  Yes Dayonna Selbe, Britta Mccreedy, MD  benzonatate (TESSALON) 100 MG capsule Take 1 capsule (100 mg total) by mouth 3 (three) times daily as needed for cough. 02/04/21  Yes Jourdyn Hasler, Britta Mccreedy, MD  buPROPion (WELLBUTRIN XL) 300 MG 24 hr tablet Take 300 mg by mouth daily. 02/11/20  Yes [provider]  citalopram (CELEXA) 20 MG tablet Take by mouth. 05/26/18  Yes [provider]  cyclobenzaprine (FLEXERIL) 5 MG tablet Take 5-10 mg by mouth every 8 (eight) hours as needed. 07/19/20  Yes [provider]  gabapentin (NEURONTIN) 300 MG capsule Take by mouth. 01/24/19  Yes [provider]  guaiFENesin (MUCINEX) 600 MG 12 hr tablet Take 1 tablet (600 mg total) by mouth 2 (two) times daily for 14 days. 02/04/21  02/18/21 Yes Hasana Alcorta, Britta Mccreedy, MD  Levothyroxine Sodium 75 MCG CAPS Take by mouth.   Yes [provider]  losartan-hydrochlorothiazide (HYZAAR) 100-25 MG tablet Take 1 tablet by mouth daily. 07/19/20  Yes [provider]  montelukast (SINGULAIR) 10 MG tablet Take 1 tablet by mouth daily.   Yes [provider]  omeprazole (PRILOSEC) 40 MG capsule Take by mouth.   Yes [provider]  traMADol (ULTRAM) 50 MG tablet tramadol 50 mg tablet  TAKE 1 TABLET BY MOUTH EVERY 8 HOURS AS NEEDED FOR PAIN UP TO FOR 5 DAYS   Yes [provider]  amoxicillin-clavulanate (AUGMENTIN) 875-125 MG tablet Take 1 tablet by mouth 2 (two) times daily. 07/26/20   Tommie Sams, DO  fluticasone (FLONASE) 50 MCG/ACT nasal spray Place 1 spray into both nostrils 2 (two) times daily for 7 days. 01/19/20 01/26/20  Eusebio Friendly B, PA-C  hydrochlorothiazide (HYDRODIURIL) 25 MG tablet Take by mouth.  07/26/20  [provider]  losartan (COZAAR) 100 MG tablet Take by mouth.  07/26/20  [provider]    Family History Family History  Problem Relation Age of Onset   Cancer Mother    Hypertension Mother    Thyroid disease Mother    Pulmonary fibrosis Father    Hypertension Father    Atrial fibrillation Father  Social History Social History   Tobacco Use   Smoking status: Never   Smokeless tobacco: Never  Vaping Use   Vaping Use: Never used  Substance Use Topics   Alcohol use: Never   Drug use: Not Currently     Allergies   Nsaids and Other   Review of Systems Review of Systems  Constitutional: Negative.   HENT:  Positive for congestion and postnasal drip. Negative for sore throat.   Respiratory:  Positive for cough, shortness of breath and wheezing. Negative for chest tightness.   Cardiovascular:  Negative for chest pain and palpitations.    Physical Exam Triage Vital Signs ED Triage Vitals  Enc Vitals Group     BP 02/04/21 1028 (!)  144/71     Pulse Rate 02/04/21 1028 62     Resp 02/04/21 1028 16     Temp 02/04/21 1028 98.3 F (36.8 C)     Temp Source 02/04/21 1028 Oral     SpO2 02/04/21 1028 100 %     Weight 02/04/21 1028 134 lb (60.8 kg)     Height 02/04/21 1025 5\' 6"  (1.676 m)     Head Circumference --      Peak Flow --      Pain Score 02/04/21 1024 5     Pain Loc --      Pain Edu? --      Excl. in GC? --    No data found.  Updated Vital Signs BP (!) 144/71 (BP Location: Left Arm)   Pulse 62   Temp 98.3 F (36.8 C) (Oral)   Resp 16   Ht 5\' 6"  (1.676 m)   Wt 60.8 kg   SpO2 100%   BMI 21.63 kg/m   Visual Acuity Right Eye Distance:   Left Eye Distance:   Bilateral Distance:    Right Eye Near:   Left Eye Near:    Bilateral Near:     Physical Exam Vitals and nursing note reviewed.  Constitutional:      General: She is not in acute distress.    Appearance: She is not ill-appearing.  HENT:     Right Ear: Tympanic membrane normal.     Left Ear: Tympanic membrane normal.  Cardiovascular:     Rate and Rhythm: Normal rate and regular rhythm.     Pulses: Normal pulses.     Heart sounds: Normal heart sounds.  Pulmonary:     Effort: Pulmonary effort is normal.     Breath sounds: Wheezing present. No rhonchi or rales.  Abdominal:     General: Bowel sounds are normal.     Palpations: Abdomen is soft.  Musculoskeletal:        General: No swelling, tenderness or signs of injury. Normal range of motion.  Neurological:     Mental Status: She is alert.     UC Treatments / Results  Labs (all labs ordered are listed, but only abnormal results are displayed) Labs Reviewed  COVID-19, FLU A+B NAA    EKG   Radiology DG Chest 2 View  Result Date: 02/04/2021 CLINICAL DATA:  56 year old female with history of purulent cough. EXAM: CHEST - 2 VIEW COMPARISON:  Chest x-ray 05/10/2020. FINDINGS: Lung volumes are normal. No consolidative airspace disease. No pleural effusions. No pneumothorax. No  pulmonary nodule or mass noted. Pulmonary vasculature and the cardiomediastinal silhouette are within normal limits. IMPRESSION: No radiographic evidence of acute cardiopulmonary disease. Electronically Signed   By: 57.D.  On: 02/04/2021 11:08    Procedures Procedures (including critical care time)  Medications Ordered in UC Medications - No data to display  Initial Impression / Assessment and Plan / UC Course  I have reviewed the triage vital signs and the nursing notes.  Pertinent labs & imaging results that were available during my care of the patient were reviewed by me and considered in my medical decision making (see chart for details).     1.  Acute bronchitis: Chest x-ray is negative for acute lung infiltrate COVID-19/flu a plus B PCR test has been sent Maintain adequate hydration Tessalon Perles as needed for cough Albuterol inhaler as needed for shortness of breath or wheezing Mucinex twice daily Return to urgent care if symptoms worsen Final Clinical Impressions(s) / UC Diagnoses   Final diagnoses:  Acute bronchitis, unspecified organism     Discharge Instructions      Please use medications as prescribed Your chest x-ray is negative for pneumonia We will call you with recommendations if labs are abnormal Return to urgent care if you have worsening symptoms.   ED Prescriptions     Medication Sig Dispense Auth. Provider   benzonatate (TESSALON) 100 MG capsule Take 1 capsule (100 mg total) by mouth 3 (three) times daily as needed for cough. 21 capsule Kaileena Obi, Britta Mccreedy, MD   guaiFENesin (MUCINEX) 600 MG 12 hr tablet Take 1 tablet (600 mg total) by mouth 2 (two) times daily for 14 days. 28 tablet Alivea Gladson, Britta Mccreedy, MD   albuterol (VENTOLIN HFA) 108 (90 Base) MCG/ACT inhaler Inhale 1 puff into the lungs every 6 (six) hours as needed for wheezing or shortness of breath. 18 g Rawleigh Rode, Britta Mccreedy, MD      PDMP not reviewed this encounter.    Merrilee Jansky, MD 02/04/21 2120

## 2021-02-04 NOTE — Discharge Instructions (Addendum)
Please use medications as prescribed Your chest x-ray is negative for pneumonia We will call you with recommendations if labs are abnormal Return to urgent care if you have worsening symptoms.

## 2021-02-04 NOTE — ED Triage Notes (Signed)
Pt c/o cough, green mucous, breathing pain around shoulder pain, congestion x 4 days. Diarrhea x 1 day

## 2021-02-05 LAB — SARS CORONAVIRUS 2 (TAT 6-24 HRS): SARS Coronavirus 2: NEGATIVE

## 2021-04-26 ENCOUNTER — Other Ambulatory Visit: Payer: Self-pay

## 2021-04-26 ENCOUNTER — Ambulatory Visit
Admission: EM | Admit: 2021-04-26 | Discharge: 2021-04-26 | Disposition: A | Discharge status: Home / Self Care | Payer: 59 | Attending: Physician Assistant | Admitting: Physician Assistant

## 2021-04-26 DIAGNOSIS — J069 Acute upper respiratory infection, unspecified: Secondary | ICD-10-CM | POA: Diagnosis not present

## 2021-04-26 DIAGNOSIS — Z20822 Contact with and (suspected) exposure to covid-19: Secondary | ICD-10-CM | POA: Diagnosis not present

## 2021-04-26 DIAGNOSIS — R051 Acute cough: Secondary | ICD-10-CM | POA: Diagnosis not present

## 2021-04-26 DIAGNOSIS — M25512 Pain in left shoulder: Secondary | ICD-10-CM | POA: Diagnosis not present

## 2021-04-26 DIAGNOSIS — R0981 Nasal congestion: Secondary | ICD-10-CM | POA: Diagnosis not present

## 2021-04-26 LAB — SARS CORONAVIRUS 2 (TAT 6-24 HRS): SARS Coronavirus 2: NEGATIVE

## 2021-04-26 MED ORDER — PSEUDOEPH-BROMPHEN-DM 30-2-10 MG/5ML PO SYRP: 10.0000 mL | ORAL_SOLUTION | Freq: Four times a day (QID) | ORAL | 0 refills | Status: AC | PRN

## 2021-04-26 MED ORDER — IPRATROPIUM BROMIDE 0.06 % NA SOLN: 2.0000 | Freq: Four times a day (QID) | NASAL | 0 refills | Status: AC

## 2021-04-26 MED ORDER — BACLOFEN 10 MG PO TABS: 10.0000 mg | ORAL_TABLET | Freq: Three times a day (TID) | ORAL | 0 refills | Status: DC | PRN

## 2021-04-26 MED ORDER — PREDNISONE 10 MG PO TABS: ORAL_TABLET | ORAL | 0 refills | Status: DC

## 2021-04-26 NOTE — ED Provider Notes (Signed)
[facility]    CSN: [id number] Arrival date & time: 04/26/21  0840      History   Chief Complaint Chief Complaint  Patient presents with   Cough   Nasal Congestion   Shoulder Pain    Left     HPI Linda Barnes is a 56 y.o. female presenting for multiple complaints.  First she states she has been ill for 4 days with fatigue, headache, cough, congestion, sore throat and postnasal drainage.  Reports he feels like someone hit her face with a frying pan.  Denies fever.  No breathing trouble, nausea/vomiting or diarrhea.  No sick contacts or known exposure to flu or COVID.  Does work at a daycare.  Says she knows she does not have COVID because she does not have a fever.  Has taken Mucinex for the congestion.  Patient also reporting left shoulder pain for the past 5 days.  States she has a heavy bag that she wears on the left shoulder and also does a lot of lifting at daycare.  Thinks it could be related to that.  No associated chest pain, neck pain, numbness/tingling or weakness, palpitations, breathing difficulty.  Has tried Tylenol.  No improvement.  No other complaints.  HPI  Past Medical History:  Diagnosis Date   Anxiety    Depression    Hypertension    Thyroid disease     There are no problems to display for this patient.   Past Surgical History:  Procedure Laterality Date   ABDOMINAL HYSTERECTOMY     CHOLECYSTECTOMY      OB History   No obstetric history on file.      Home Medications    Prior to Admission medications   Medication Sig Start Date End Date Taking? Authorizing Provider  baclofen (LIORESAL) 10 MG tablet Take 1 tablet (10 mg total) by mouth 3 (three) times daily as needed for muscle spasms. 04/26/21  Yes Eusebio Friendly B, PA-C  brompheniramine-pseudoephedrine-DM 30-2-10 MG/5ML syrup Take 10 mLs by mouth 4 (four) times daily as needed for up to 7 days. 04/26/21 05/03/21 Yes Eusebio Friendly B, PA-C  ipratropium (ATROVENT) 0.06 % nasal  spray Place 2 sprays into both nostrils 4 (four) times daily. 04/26/21  Yes Shirlee Latch, PA-C  predniSONE (DELTASONE) 10 MG tablet Take 5 tabs p.o. on day 1 and decrease by 1 tablet daily until complete 04/26/21  Yes Eusebio Friendly B, PA-C  albuterol (VENTOLIN HFA) 108 (90 Base) MCG/ACT inhaler Inhale 1 puff into the lungs every 6 (six) hours as needed for wheezing or shortness of breath. 02/04/21   Merrilee Jansky, MD  buPROPion (WELLBUTRIN XL) 300 MG 24 hr tablet Take 300 mg by mouth daily. 02/11/20   [provider]  citalopram (CELEXA) 20 MG tablet Take by mouth. 05/26/18   [provider]  fluticasone (FLONASE) 50 MCG/ACT nasal spray Place 1 spray into both nostrils 2 (two) times daily for 7 days. 01/19/20 01/26/20  Eusebio Friendly B, PA-C  gabapentin (NEURONTIN) 300 MG capsule Take by mouth. 01/24/19   [provider]  Levothyroxine Sodium 75 MCG CAPS Take by mouth.    [provider]  losartan-hydrochlorothiazide (HYZAAR) 100-25 MG tablet Take 1 tablet by mouth daily. 07/19/20   [provider]  montelukast (SINGULAIR) 10 MG tablet Take 1 tablet by mouth daily.    [provider]  omeprazole (PRILOSEC) 40 MG capsule Take by mouth.    [provider]  traMADol Janean Sark)  50 MG tablet tramadol 50 mg tablet  TAKE 1 TABLET BY MOUTH EVERY 8 HOURS AS NEEDED FOR PAIN UP TO FOR 5 DAYS    [provider]  hydrochlorothiazide (HYDRODIURIL) 25 MG tablet Take by mouth.  07/26/20  [provider]  losartan (COZAAR) 100 MG tablet Take by mouth.  07/26/20  [provider]    Family History Family History  Problem Relation Age of Onset   Cancer Mother    Hypertension Mother    Thyroid disease Mother    Pulmonary fibrosis Father    Hypertension Father    Atrial fibrillation Father     Social History Social History   Tobacco Use   Smoking status: Never   Smokeless tobacco: Never  Vaping Use   Vaping Use: Never  used  Substance Use Topics   Alcohol use: Never   Drug use: Not Currently     Allergies   Nsaids and Other   Review of Systems Review of Systems  Constitutional:  Positive for fatigue. Negative for chills, diaphoresis and fever.  HENT:  Positive for congestion, ear pain, postnasal drip, rhinorrhea, sinus pressure, sinus pain and sore throat.   Respiratory:  Positive for cough. Negative for shortness of breath.   Cardiovascular:  Negative for chest pain and palpitations.  Gastrointestinal:  Negative for abdominal pain, nausea and vomiting.  Musculoskeletal:  Positive for arthralgias. Negative for myalgias.  Skin:  Negative for rash.  Neurological:  Positive for headaches. Negative for weakness.  Hematological:  Negative for adenopathy.    Physical Exam Triage Vital Signs ED Triage Vitals  Enc Vitals Group     BP 04/26/21 0858 126/77     Pulse Rate 04/26/21 0858 90     Resp 04/26/21 0858 16     Temp 04/26/21 0858 98.4 F (36.9 C)     Temp Source 04/26/21 0858 Oral     SpO2 04/26/21 0858 100 %     Weight --      Height --      Head Circumference --      Peak Flow --      Pain Score 04/26/21 0856 5     Pain Loc --      Pain Edu? --      Excl. in GC? --    No data found.  Updated Vital Signs BP 126/77 (BP Location: Right Arm)    Pulse 90    Temp 98.4 F (36.9 C) (Oral)    Resp 16    SpO2 100%   Physical Exam Vitals and nursing note reviewed.  Constitutional:      General: She is not in acute distress.    Appearance: Normal appearance. She is ill-appearing. She is not toxic-appearing.  HENT:     Head: Normocephalic and atraumatic.     Right Ear: Tympanic membrane, ear canal and external ear normal.     Left Ear: Tympanic membrane, ear canal and external ear normal.     Nose: Congestion present.     Mouth/Throat:     Mouth: Mucous membranes are moist.     Pharynx: Oropharynx is clear. Posterior oropharyngeal erythema present.  Eyes:     General: No scleral  icterus.       Right eye: No discharge.        Left eye: No discharge.     Conjunctiva/sclera: Conjunctivae normal.  Cardiovascular:     Rate and Rhythm: Normal rate and regular rhythm.     Heart  sounds: Normal heart sounds.  Pulmonary:     Effort: Pulmonary effort is normal. No respiratory distress.     Breath sounds: Normal breath sounds. No wheezing, rhonchi or rales.  Musculoskeletal:     Left shoulder: Tenderness (TTP  periscapular muscles) present. No swelling, deformity or bony tenderness. Normal range of motion.     Cervical back: Normal range of motion and neck supple.  Skin:    General: Skin is dry.  Neurological:     General: No focal deficit present.     Mental Status: She is alert and oriented to person, place, and time. Mental status is at baseline.     Motor: No weakness.     Coordination: Coordination normal.     Gait: Gait normal.  Psychiatric:        Mood and Affect: Mood normal.        Behavior: Behavior normal.        Thought Content: Thought content normal.     UC Treatments / Results  Labs (all labs ordered are listed, but only abnormal results are displayed) Labs Reviewed  SARS CORONAVIRUS 2 (TAT 6-24 HRS)    EKG   Radiology No results found.  Procedures Procedures (including critical care time)  Medications Ordered in UC Medications - No data to display  Initial Impression / Assessment and Plan / UC Course  I have reviewed the triage vital signs and the nursing notes.  Pertinent labs & imaging results that were available during my care of the patient were reviewed by me and considered in my medical decision making (see chart for details).  56 year old female presenting for cough, congestion, sore throat and sinus pressure for the past 5 days as well as left atraumatic shoulder pain for the past 5 days.  PCR COVID test obtained.  Current CDC guidelines, isolation protocol and ED precautions reviewed if positive.  Advised patient her URI  symptoms are consistent with a viral upper respiratory infection.  Reviewed supportive care.  Sent Bromfed-DM and Atrovent nasal spray.  Encouraged her to increase rest and fluids.  Tylenol for discomfort.  Reviewed she may be sick for a week or 2 but should be feeling better soon.  Left shoulder pain not associate with any red flag signs or symptoms and no trauma.  Likely muscle strain.  Treating this time with baclofen and prednisone since she cannot take NSAIDs.  Reviewed heat, ice, Salonpas patches/lidocaine patches.  Reviewed return and ER precautions.  Work note given.   Final Clinical Impressions(s) / UC Diagnoses   Final diagnoses:  Viral upper respiratory tract infection  Sinus congestion  Acute cough  Acute pain of left shoulder     Discharge Instructions      -You have a virus.  Most people feel better in 7 to 10 days but sometimes it can take a little longer.  I have sent a cough medication and nasal spray.  The prednisone that I have sent should help your sinus congestion/inflammation and also your shoulder discomfort. - Increase rest and fluids. - The COVID test will be back tomorrow.  If you are positive you need to isolate 5 days from symptom onset and wear mask for 5 days.  If it is negative it is another virus. - The shoulder pain is consistent with muscle strain.  I sent a muscle relaxer and the prednisone.   -Follow-up as needed.     ED Prescriptions     Medication Sig Dispense Auth. Provider  predniSONE (DELTASONE) 10 MG tablet Take 5 tabs p.o. on day 1 and decrease by 1 tablet daily until complete 15 tablet Eusebio Friendly B, PA-C   brompheniramine-pseudoephedrine-DM 30-2-10 MG/5ML syrup Take 10 mLs by mouth 4 (four) times daily as needed for up to 7 days. 150 mL Eusebio Friendly B, PA-C   ipratropium (ATROVENT) 0.06 % nasal spray Place 2 sprays into both nostrils 4 (four) times daily. 15 mL Eusebio Friendly B, PA-C   baclofen (LIORESAL) 10 MG tablet Take 1 tablet (10  mg total) by mouth 3 (three) times daily as needed for muscle spasms. 15 each Gareth Morgan      PDMP not reviewed this encounter.   Shirlee Latch, PA-C 04/26/21 1046

## 2021-04-26 NOTE — ED Triage Notes (Signed)
Patient presents to Urgent Care with multiple complaints of cough, congestion, and left shoulder pain x 5 days. Treating cough/congestion with mucinezx. Treating pain with tylenol. 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

## 2021-04-26 NOTE — Discharge Instructions (Signed)
-  You have a virus.  Most people feel better in 7 to 10 days but sometimes it can take a little longer.  I have sent a cough medication and nasal spray.  The prednisone that I have sent should help your sinus congestion/inflammation and also your shoulder discomfort. - Increase rest and fluids. - The COVID test will be back tomorrow.  If you are positive you need to isolate 5 days from symptom onset and wear mask for 5 days.  If it is negative it is another virus. - The shoulder pain is consistent with muscle strain.  I sent a muscle relaxer and the prednisone.   -Follow-up as needed.

## 2021-10-31 ENCOUNTER — Ambulatory Visit: Payer: 59

## 2022-02-15 ENCOUNTER — Ambulatory Visit
Admission: EM | Admit: 2022-02-15 | Discharge: 2022-02-15 | Disposition: A | Discharge status: Home / Self Care | Payer: 59 | Attending: Emergency Medicine | Admitting: Emergency Medicine

## 2022-02-15 DIAGNOSIS — H6501 Acute serous otitis media, right ear: Secondary | ICD-10-CM

## 2022-02-15 MED ORDER — AMOXICILLIN 500 MG PO CAPS: 500.0000 mg | ORAL_CAPSULE | Freq: Two times a day (BID) | ORAL | 0 refills | Status: AC

## 2022-02-15 MED ORDER — FLUTICASONE PROPIONATE 50 MCG/ACT NA SUSP: 1.0000 | Freq: Every day | NASAL | 0 refills | Status: AC

## 2022-02-15 NOTE — ED Provider Notes (Signed)
[facility]    CSN: [id number] Arrival date & time: 02/15/22  1616      History   Chief Complaint Chief Complaint  Patient presents with   Otalgia   Nasal Congestion    HPI Linda Barnes is a 56 y.o. female.   Patient presents with nasal congestion, rhinorrhea, sinus pressure to the left side and a productive cough beginning 1 day ago.  Endorses right-sided ear pain with a sensation of feeling as if she is underwater.  Has attempted use of Mucinex with minimal improvement.  No known sick contacts.  Tolerating food and liquids but endorses a decreased appetite.  No pertinent medical history.  Non-smoker.  Denies ear drainage, pruritus, fever, chills.  Past Medical History:  Diagnosis Date   Anxiety    Depression    Hypertension    Thyroid disease     There are no problems to display for this patient.   Past Surgical History:  Procedure Laterality Date   ABDOMINAL HYSTERECTOMY     CHOLECYSTECTOMY      OB History   No obstetric history on file.      Home Medications    Prior to Admission medications   Medication Sig Start Date End Date Taking? Authorizing Provider  albuterol (VENTOLIN HFA) 108 (90 Base) MCG/ACT inhaler Inhale 1 puff into the lungs every 6 (six) hours as needed for wheezing or shortness of breath. 02/04/21   LampteyBritta Mccreedy, MD  baclofen (LIORESAL) 10 MG tablet Take 1 tablet (10 mg total) by mouth 3 (three) times daily as needed for muscle spasms. 04/26/21   Shirlee Latch, PA-C  buPROPion (WELLBUTRIN XL) 300 MG 24 hr tablet Take 300 mg by mouth daily. 02/11/20   [provider]  citalopram (CELEXA) 20 MG tablet Take by mouth. 05/26/18   [provider]  fluticasone (FLONASE) 50 MCG/ACT nasal spray Place 1 spray into both nostrils 2 (two) times daily for 7 days. 01/19/20 01/26/20  Eusebio Friendly B, PA-C  gabapentin (NEURONTIN) 300 MG capsule Take by mouth. 01/24/19   [provider]  ipratropium  (ATROVENT) 0.06 % nasal spray Place 2 sprays into both nostrils 4 (four) times daily. 04/26/21   Shirlee Latch, PA-C  Levothyroxine Sodium 75 MCG CAPS Take by mouth.    [provider]  losartan-hydrochlorothiazide (HYZAAR) 100-25 MG tablet Take 1 tablet by mouth daily. 07/19/20   [provider]  montelukast (SINGULAIR) 10 MG tablet Take 1 tablet by mouth daily.    [provider]  omeprazole (PRILOSEC) 40 MG capsule Take by mouth.    [provider]  predniSONE (DELTASONE) 10 MG tablet Take 5 tabs p.o. on day 1 and decrease by 1 tablet daily until complete 04/26/21   Eusebio Friendly B, PA-C  traMADol (ULTRAM) 50 MG tablet tramadol 50 mg tablet  TAKE 1 TABLET BY MOUTH EVERY 8 HOURS AS NEEDED FOR PAIN UP TO FOR 5 DAYS    [provider]  hydrochlorothiazide (HYDRODIURIL) 25 MG tablet Take by mouth.  07/26/20  [provider]  losartan (COZAAR) 100 MG tablet Take by mouth.  07/26/20  [provider]    Family History Family History  Problem Relation Age of Onset   Cancer Mother    Hypertension Mother    Thyroid disease Mother    Pulmonary fibrosis Father    Hypertension Father    Atrial fibrillation Father     Social History Social History   Tobacco Use  Smoking status: Never   Smokeless tobacco: Never  Vaping Use   Vaping Use: Never used  Substance Use Topics   Alcohol use: Never   Drug use: Not Currently     Allergies   Nsaids and Other   Review of Systems Review of Systems  HENT:  Positive for ear pain.      Physical Exam Triage Vital Signs ED Triage Vitals [02/15/22 1643]  Enc Vitals Group     BP 104/60     Pulse Rate 76     Resp      Temp 98.1 F (36.7 C)     Temp Source Oral     SpO2 98 %     Weight 136 lb (61.7 kg)     Height 5\' 6"  (1.676 m)     Head Circumference      Peak Flow      Pain Score 10     Pain Loc      Pain Edu?      Excl. in GC?    No data found.  Updated Vital Signs BP  104/60 (BP Location: Left Arm)   Pulse 76   Temp 98.1 F (36.7 C) (Oral)   Ht 5\' 6"  (1.676 m)   Wt 136 lb (61.7 kg)   SpO2 98%   BMI 21.95 kg/m   Visual Acuity Right Eye Distance:   Left Eye Distance:   Bilateral Distance:    Right Eye Near:   Left Eye Near:    Bilateral Near:     Physical Exam Constitutional:      Appearance: Normal appearance.  HENT:     Head: Normocephalic.     Left Ear: Tympanic membrane, ear canal and external ear normal.     Ears:     Comments: Erythema and bubbling to the right tympanic membrane    Nose: Nose normal.     Mouth/Throat:     Mouth: Mucous membranes are moist.     Pharynx: Oropharynx is clear.  Eyes:     Extraocular Movements: Extraocular movements intact.  Cardiovascular:     Pulses: Normal pulses.     Heart sounds: Normal heart sounds.  Pulmonary:     Effort: Pulmonary effort is normal.     Breath sounds: Normal breath sounds.  Skin:    General: Skin is warm and dry.  Neurological:     Mental Status: She is alert and oriented to person, place, and time. Mental status is at baseline.  Psychiatric:        Mood and Affect: Mood normal.        Behavior: Behavior normal.      UC Treatments / Results  Labs (all labs ordered are listed, but only abnormal results are displayed) Labs Reviewed - No data to display  EKG   Radiology No results found.  Procedures Procedures (including critical care time)  Medications Ordered in UC Medications - No data to display  Initial Impression / Assessment and Plan / UC Course  I have reviewed the triage vital signs and the nursing notes.  Pertinent labs & imaging results that were available during my care of the patient were reviewed by me and considered in my medical decision making (see chart for details).  Nonrecurrent acute serous otitis media of right ear  Based on presentation of tympanic membrane we will move forward with bacterial coverage, discussed with patient,  amoxicillin course prescribed as well as fluticasone for management of sinus congestion, continue use  of over-the-counter medication if deemed helpful, recommended over-the-counter analgesics and warm compresses to the external ear canal for additional support, advised against any ear cleaning or object placement within the canal to prevent further irritation, given strict precautions that if no improvement seen if symptoms persisting past use of medication to follow-up for urgent care for reevaluation Final Clinical Impressions(s) / UC Diagnoses   Final diagnoses:  None   Discharge Instructions   None    ED Prescriptions   None    PDMP not reviewed this encounter.   Valinda Hoar, NP 02/15/22 [phone]

## 2022-02-15 NOTE — ED Triage Notes (Signed)
Pt c/o RT ear ache onset yesterday, congestion, cough

## 2022-02-15 NOTE — Discharge Instructions (Signed)
On exam there is redness to your eardrum as well as a bubbling appearance therefore you will be treated with an antibiotic for infection  Take amoxicillin every morning and every evening for 10 days ideally you begin to see improvement after 2 days of use and steady progression  You may use Tylenol 500 to 1000 mg every 6 hours as needed for management of pain  Begin Flonase every morning to help clear sinuses and further reduce pressure to the ears into the face  Avoid any ear cleaning or object placement within the ear canal to prevent further irritation  If your symptoms persist past use of medication please follow-up for reevaluation of your symptoms

## 2022-11-13 DIAGNOSIS — R112 Nausea with vomiting, unspecified: Secondary | ICD-10-CM | POA: Diagnosis not present

## 2022-11-13 DIAGNOSIS — K219 Gastro-esophageal reflux disease without esophagitis: Secondary | ICD-10-CM | POA: Diagnosis not present

## 2022-11-13 DIAGNOSIS — F419 Anxiety disorder, unspecified: Secondary | ICD-10-CM | POA: Diagnosis not present

## 2022-11-13 DIAGNOSIS — R519 Headache, unspecified: Secondary | ICD-10-CM | POA: Diagnosis not present

## 2022-11-13 DIAGNOSIS — R1033 Periumbilical pain: Secondary | ICD-10-CM | POA: Diagnosis not present

## 2022-11-13 DIAGNOSIS — R1084 Generalized abdominal pain: Secondary | ICD-10-CM | POA: Diagnosis not present

## 2022-11-13 DIAGNOSIS — F32A Depression, unspecified: Secondary | ICD-10-CM | POA: Diagnosis not present

## 2022-11-13 DIAGNOSIS — Z800 Family history of malignant neoplasm of digestive organs: Secondary | ICD-10-CM | POA: Diagnosis not present

## 2022-11-13 DIAGNOSIS — G8929 Other chronic pain: Secondary | ICD-10-CM | POA: Diagnosis not present

## 2022-11-13 DIAGNOSIS — I10 Essential (primary) hypertension: Secondary | ICD-10-CM | POA: Diagnosis not present

## 2022-11-13 DIAGNOSIS — E1140 Type 2 diabetes mellitus with diabetic neuropathy, unspecified: Secondary | ICD-10-CM | POA: Diagnosis not present

## 2022-11-13 DIAGNOSIS — R1011 Right upper quadrant pain: Secondary | ICD-10-CM | POA: Diagnosis not present

## 2022-11-13 DIAGNOSIS — R630 Anorexia: Secondary | ICD-10-CM | POA: Diagnosis not present

## 2022-11-13 DIAGNOSIS — E876 Hypokalemia: Secondary | ICD-10-CM | POA: Diagnosis not present

## 2022-11-13 DIAGNOSIS — M545 Low back pain, unspecified: Secondary | ICD-10-CM | POA: Diagnosis not present

## 2022-11-13 DIAGNOSIS — R1012 Left upper quadrant pain: Secondary | ICD-10-CM | POA: Diagnosis not present

## 2022-11-14 DIAGNOSIS — R112 Nausea with vomiting, unspecified: Secondary | ICD-10-CM | POA: Diagnosis not present

## 2022-11-14 DIAGNOSIS — M792 Neuralgia and neuritis, unspecified: Secondary | ICD-10-CM | POA: Diagnosis not present

## 2022-11-14 DIAGNOSIS — R1110 Vomiting, unspecified: Secondary | ICD-10-CM | POA: Diagnosis not present

## 2022-11-14 DIAGNOSIS — R1013 Epigastric pain: Secondary | ICD-10-CM | POA: Diagnosis not present

## 2022-11-14 DIAGNOSIS — R1011 Right upper quadrant pain: Secondary | ICD-10-CM | POA: Diagnosis not present

## 2022-11-15 DIAGNOSIS — G8929 Other chronic pain: Secondary | ICD-10-CM | POA: Diagnosis not present

## 2022-11-15 DIAGNOSIS — R109 Unspecified abdominal pain: Secondary | ICD-10-CM | POA: Diagnosis not present

## 2023-06-17 ENCOUNTER — Ambulatory Visit
Admission: EM | Admit: 2023-06-17 | Discharge: 2023-06-17 | Disposition: A | Discharge status: Home / Self Care | Attending: Family Medicine | Admitting: Family Medicine

## 2023-06-17 ENCOUNTER — Encounter: Payer: Self-pay | Admitting: Emergency Medicine

## 2023-06-17 ENCOUNTER — Ambulatory Visit (INDEPENDENT_AMBULATORY_CARE_PROVIDER_SITE_OTHER)

## 2023-06-17 DIAGNOSIS — M5442 Lumbago with sciatica, left side: Secondary | ICD-10-CM | POA: Diagnosis not present

## 2023-06-17 DIAGNOSIS — G8929 Other chronic pain: Secondary | ICD-10-CM | POA: Diagnosis not present

## 2023-06-17 DIAGNOSIS — M51362 Other intervertebral disc degeneration, lumbar region with discogenic back pain and lower extremity pain: Secondary | ICD-10-CM | POA: Diagnosis not present

## 2023-06-17 DIAGNOSIS — M5441 Lumbago with sciatica, right side: Secondary | ICD-10-CM | POA: Diagnosis not present

## 2023-06-17 LAB — URINALYSIS, W/ REFLEX TO CULTURE (INFECTION SUSPECTED)
Glucose, UA: NEGATIVE mg/dL
Hgb urine dipstick: NEGATIVE
Leukocytes,Ua: NEGATIVE
Nitrite: NEGATIVE
Protein, ur: 30 mg/dL — AB
Specific Gravity, Urine: 1.025 (ref 1.005–1.030)
pH: 6 (ref 5.0–8.0)

## 2023-06-17 MED ORDER — KETOROLAC TROMETHAMINE 60 MG/2ML IM SOLN
30.0000 mg | Freq: Once | INTRAMUSCULAR | Status: AC
  Administered 2023-06-17: 30 mg via INTRAMUSCULAR

## 2023-06-17 MED ORDER — HYDROCODONE-ACETAMINOPHEN 5-325 MG PO TABS: 2.0000 | ORAL_TABLET | ORAL | 0 refills | Status: AC | PRN

## 2023-06-17 MED ORDER — DEXAMETHASONE SODIUM PHOSPHATE 10 MG/ML IJ SOLN
10.0000 mg | Freq: Once | INTRAMUSCULAR | Status: AC
  Administered 2023-06-17: 10 mg via INTRAMUSCULAR

## 2023-06-17 MED ORDER — PREDNISONE 10 MG (21) PO TBPK: ORAL_TABLET | Freq: Every day | ORAL | 0 refills | Status: AC

## 2023-06-17 MED ORDER — METHOCARBAMOL 750 MG PO TABS: 750.0000 mg | ORAL_TABLET | Freq: Three times a day (TID) | ORAL | 0 refills | Status: AC | PRN

## 2023-06-17 NOTE — Discharge Instructions (Signed)
 If medication was prescribed, stop by the pharmacy to pick up your prescriptions.  For your back pain, Take muscle relaxer and oxycodone as needed for pain. Do not drive or operate heavy machinery while taking these medications as they can cause drowsiness. Start your prednisone tomorrow. Consider stopping by the pharmacy or dollar store to pick up some Lidocaine patches. Apply for 12 hours and then remove.   Watch for worsening symptoms such as an increasing weakness or loss of sensation in your arms or legs, increasing pain and/or the loss of bladder or bowel function. Should any of these occur, go to the emergency department immediately.

## 2023-06-17 NOTE — ED Provider Notes (Addendum)
 [facility]    CSN: [id number] Arrival date & time: 06/17/23  8295      History   Chief Complaint Chief Complaint  Patient presents with   Back Pain    HPI  HPI Linda Barnes is a 58 y.o. female.   Linda Barnes presents for low back pain that started about a week ago.  Pain has gradually gotten worse. Pain radiates down both of her legs intermittently.  When she coughs or sneezes, pain gets worse. No injury or falls. Has history of back pain but this is worse due to the intensity of pain. She is not able to move like she normally does. She was not able to sleep due to her pain.  She has known degenerative disc disease. Pain is not letting up for nothing. Took 5 mg muscle relaxer and Tylenol but she vomited the pain back up.    Pain described as tightness and "like something is in their twisting."  She has to move out of her home by the 27th of this month.  Says she has to pack. She doesn't know if she lifted something heavy or bent over too much.    No bladder or bowel incontinence. No saddle paresthesia.       Past Medical History:  Diagnosis Date   Anxiety    Depression    Hypertension    Thyroid disease     There are no active problems to display for this patient.   Past Surgical History:  Procedure Laterality Date   ABDOMINAL HYSTERECTOMY     CHOLECYSTECTOMY      OB History   No obstetric history on file.      Home Medications    Prior to Admission medications   Medication Sig Start Date End Date Taking? Authorizing Provider  BREZTRI AEROSPHERE 160-9-4.8 MCG/ACT AERO SMARTSIG:2 Puff(s) By Mouth Twice Daily 06/07/23  Yes [provider]  HYDROcodone-acetaminophen (NORCO/VICODIN) 5-325 MG tablet Take 2 tablets by mouth every 4 (four) hours as needed. 06/17/23  Yes Chrisandra Wiemers, DO  methocarbamol (ROBAXIN) 750 MG tablet Take 1 tablet (750 mg total) by mouth every 8 (eight) hours as needed for muscle spasms. 06/17/23  Yes Dalexa Gentz,  Elisama Thissen, DO  predniSONE (STERAPRED UNI-PAK 21 TAB) 10 MG (21) TBPK tablet Take by mouth daily. Take 6 tabs by mouth daily for 1, then 5 tabs for 1 day, then 4 tabs for 1 day, then 3 tabs for 1 day, then 2 tabs for 1 day, then 1 tab for 1 day. 06/17/23  Yes Gladiola Madore, DO  albuterol (VENTOLIN HFA) 108 (90 Base) MCG/ACT inhaler Inhale 1 puff into the lungs every 6 (six) hours as needed for wheezing or shortness of breath. 02/04/21   Merrilee Jansky, MD  buPROPion (WELLBUTRIN XL) 300 MG 24 hr tablet Take 300 mg by mouth daily. 02/11/20   [provider]  citalopram (CELEXA) 20 MG tablet Take by mouth. 05/26/18   [provider]  fluticasone (FLONASE) 50 MCG/ACT nasal spray Place 1 spray into both nostrils daily. 02/15/22   Valinda Hoar, NP  gabapentin (NEURONTIN) 300 MG capsule Take by mouth. 01/24/19   [provider]  ipratropium (ATROVENT) 0.06 % nasal spray Place 2 sprays into both nostrils 4 (four) times daily. 04/26/21   Shirlee Latch, PA-C  Levothyroxine Sodium 75 MCG CAPS Take by mouth.    [provider]  losartan-hydrochlorothiazide (HYZAAR) 100-25 MG tablet Take 1 tablet by mouth daily. 07/19/20  [provider]  montelukast (SINGULAIR) 10 MG tablet Take 1 tablet by mouth daily.    [provider]  omeprazole (PRILOSEC) 40 MG capsule Take by mouth.    [provider]  hydrochlorothiazide (HYDRODIURIL) 25 MG tablet Take by mouth.  07/26/20  [provider]  losartan (COZAAR) 100 MG tablet Take by mouth.  07/26/20  [provider]    Family History Family History  Problem Relation Age of Onset   Cancer Mother    Hypertension Mother    Thyroid disease Mother    Pulmonary fibrosis Father    Hypertension Father    Atrial fibrillation Father     Social History Social History   Tobacco Use   Smoking status: Never   Smokeless tobacco: Never  Vaping Use   Vaping status: Never Used  Substance Use  Topics   Alcohol use: Never   Drug use: Not Currently     Allergies   Nsaids and Other   Review of Systems Review of Systems: egative unless otherwise stated in HPI.      Physical Exam Triage Vital Signs ED Triage Vitals  Encounter Vitals Group     BP 06/17/23 0944 131/75     Systolic BP Percentile --      Diastolic BP Percentile --      Pulse Rate 06/17/23 0944 (!) 102     Resp 06/17/23 0944 15     Temp 06/17/23 0944 98 F (36.7 C)     Temp Source 06/17/23 0944 Oral     SpO2 06/17/23 0944 96 %     Weight 06/17/23 0941 136 lb 0.4 oz (61.7 kg)     Height 06/17/23 0941 5\' 6"  (1.676 m)     Head Circumference --      Peak Flow --      Pain Score 06/17/23 0941 10     Pain Loc --      Pain Education --      Exclude from Growth Chart --    No data found.  Updated Vital Signs BP 131/75 (BP Location: Right Arm)   Pulse (!) 102   Temp 98 F (36.7 C) (Oral)   Resp 15   Ht 5\' 6"  (1.676 m)   Wt 61.7 kg   SpO2 96%   BMI 21.95 kg/m   Visual Acuity Right Eye Distance:   Left Eye Distance:   Bilateral Distance:    Right Eye Near:   Left Eye Near:    Bilateral Near:     Physical Exam GEN: uncomfortable female writhing in wheelchair in pain  CVS: well perfused, tachycardic RESP: speaking in full sentences without pause, no respiratory distress  MSK:  Lumbar spine: - Inspection: no gross deformity or asymmetry, swelling or ecchymosis. No skin changes  - Palpation: +TTP over the lumbar spinous processes, hypertonicity and TTP of bilateral lumbar paraspinal muscles, no SI joint tenderness bilaterally - ROM: limited ROM 2/2 to pain - Strength: 5/5 strength of lower extremity in L4-S1 nerve root distributions b/l - Neuro: sensation intact in the L4-S1 nerve root distribution b/l - Special testing: positive straight leg raise SKIN: warm, dry   UC Treatments / Results  Labs (all labs ordered are listed, but only abnormal results are displayed) Labs Reviewed   URINALYSIS, W/ REFLEX TO CULTURE (INFECTION SUSPECTED) - Abnormal; Notable for the following components:      Result Value   Bilirubin Urine SMALL (*)    Ketones, ur TRACE (*)  Protein, ur 30 (*)    Bacteria, UA FEW (*)    All other components within normal limits    EKG   Radiology DG Lumbar Spine Complete Result Date: 06/17/2023 CLINICAL DATA:  Low back pain EXAM: LUMBAR SPINE - COMPLETE 5 VIEW COMPARISON:  None Available. FINDINGS: Osteopenia. Five lumbar-type vertebral bodies. Moderate disc height loss at L4-5 and L5-S1. Mild at L3-4. Minimal scattered endplate osteophytes. Lower lumbar facet degenerative changes are identified as well. There is compression of the superior endplate of T12 with osteophyte formation and some sclerosis. Likely chronic. Surgical clips right upper quadrant. IMPRESSION: Osteopenia with multifocal degenerative changes greatest along the lower lumbar spine at L4-5 and L5-S1. Electronically Signed   By: Karen Kays M.D.   On: 06/17/2023 11:18     Procedures Procedures (including critical care time)  Medications Ordered in UC Medications  ketorolac (TORADOL) injection 30 mg (30 mg Intramuscular Given 06/17/23 1050)  dexamethasone (DECADRON) injection 10 mg (10 mg Intramuscular Given 06/17/23 1050)    Initial Impression / Assessment and Plan / UC Course  I have reviewed the triage vital signs and the nursing notes.  Pertinent labs & imaging results that were available during my care of the patient were reviewed by me and considered in my medical decision making (see chart for details).      Pt is a 58 y.o.  female with worsening lower back pain for over a week.  Has history of low back pain.  Decadron 10 mg IM and Toradol 30 mg IM given with some improvement.   On 11/23/19, Pt had an MRI Lumbar spine that showed multilevel degenerative changes of the lumbar spine detailed above, most advanced at L4-L5 where there is a disc bulge resulting in effacement  of the left subarticular zone and contact with the descending left L5 nerve root.   Urinalysis without evidence of UTI or hematuria. Obtained lumbar plain films.  Xray personally interpreted by me were unremarkable for fracture, or significant malalignment.  Radiologist report reviewed and notes Osteopenia with multifocal degenerative changes greatest along the lower lumbar spine at L4-5 and L5-S1.  Patient to gradually return to normal activities, as tolerated and continue ordinary activities within the limits permitted by pain. Prescribed short course of Norco for severe pain, prednisone taper and muscle relaxer  for pain relief.  Continue Gabapentin and Lidocaine patches PRN for multimodal pain relief. Counseled patient on red flag symptoms and when to seek immediate care.  Patient to follow up with orthopedic provider if symptoms do not improve with conservative treatment.  Return and ED precautions given.    Discussed MDM, treatment plan and plan for follow-up with patient who agrees with plan.   Final Clinical Impressions(s) / UC Diagnoses   Final diagnoses:  Degeneration of intervertebral disc of lumbar region with discogenic back pain and lower extremity pain  Chronic bilateral low back pain with bilateral sciatica     Discharge Instructions      If medication was prescribed, stop by the pharmacy to pick up your prescriptions.  For your back pain, Take muscle relaxer and oxycodone as needed for pain. Do not drive or operate heavy machinery while taking these medications as they can cause drowsiness. Start your prednisone tomorrow. Consider stopping by the pharmacy or dollar store to pick up some Lidocaine patches. Apply for 12 hours and then remove.   Watch for worsening symptoms such as an increasing weakness or loss of sensation in your arms or legs,  increasing pain and/or the loss of bladder or bowel function. Should any of these occur, go to the emergency department immediately.         ED Prescriptions     Medication Sig Dispense Auth. Provider   methocarbamol (ROBAXIN) 750 MG tablet Take 1 tablet (750 mg total) by mouth every 8 (eight) hours as needed for muscle spasms. 30 tablet Mclain Freer, DO   predniSONE (STERAPRED UNI-PAK 21 TAB) 10 MG (21) TBPK tablet Take by mouth daily. Take 6 tabs by mouth daily for 1, then 5 tabs for 1 day, then 4 tabs for 1 day, then 3 tabs for 1 day, then 2 tabs for 1 day, then 1 tab for 1 day. 21 tablet Xee Hollman, DO   HYDROcodone-acetaminophen (NORCO/VICODIN) 5-325 MG tablet Take 2 tablets by mouth every 4 (four) hours as needed. 12 tablet Quentina Fronek, DO      I have reviewed the PDMP during this encounter.       Katha Cabal, DO 06/17/23 1136

## 2023-06-17 NOTE — ED Triage Notes (Signed)
 Patient c/o lower back pain that started couple weeks ago.  Patient denies injury or fall.

## 2023-06-21 DIAGNOSIS — F339 Major depressive disorder, recurrent, unspecified: Secondary | ICD-10-CM | POA: Diagnosis not present

## 2023-08-02 IMAGING — CR DG CHEST 2V
2 series · 2 of 2 positions shown · non-contrast
Comparison: Chest x-ray 05/10/2020.

CLINICAL DATA: 55-year-old female with history of purulent cough.

EXAM:
CHEST - 2 VIEW

[chest pa]
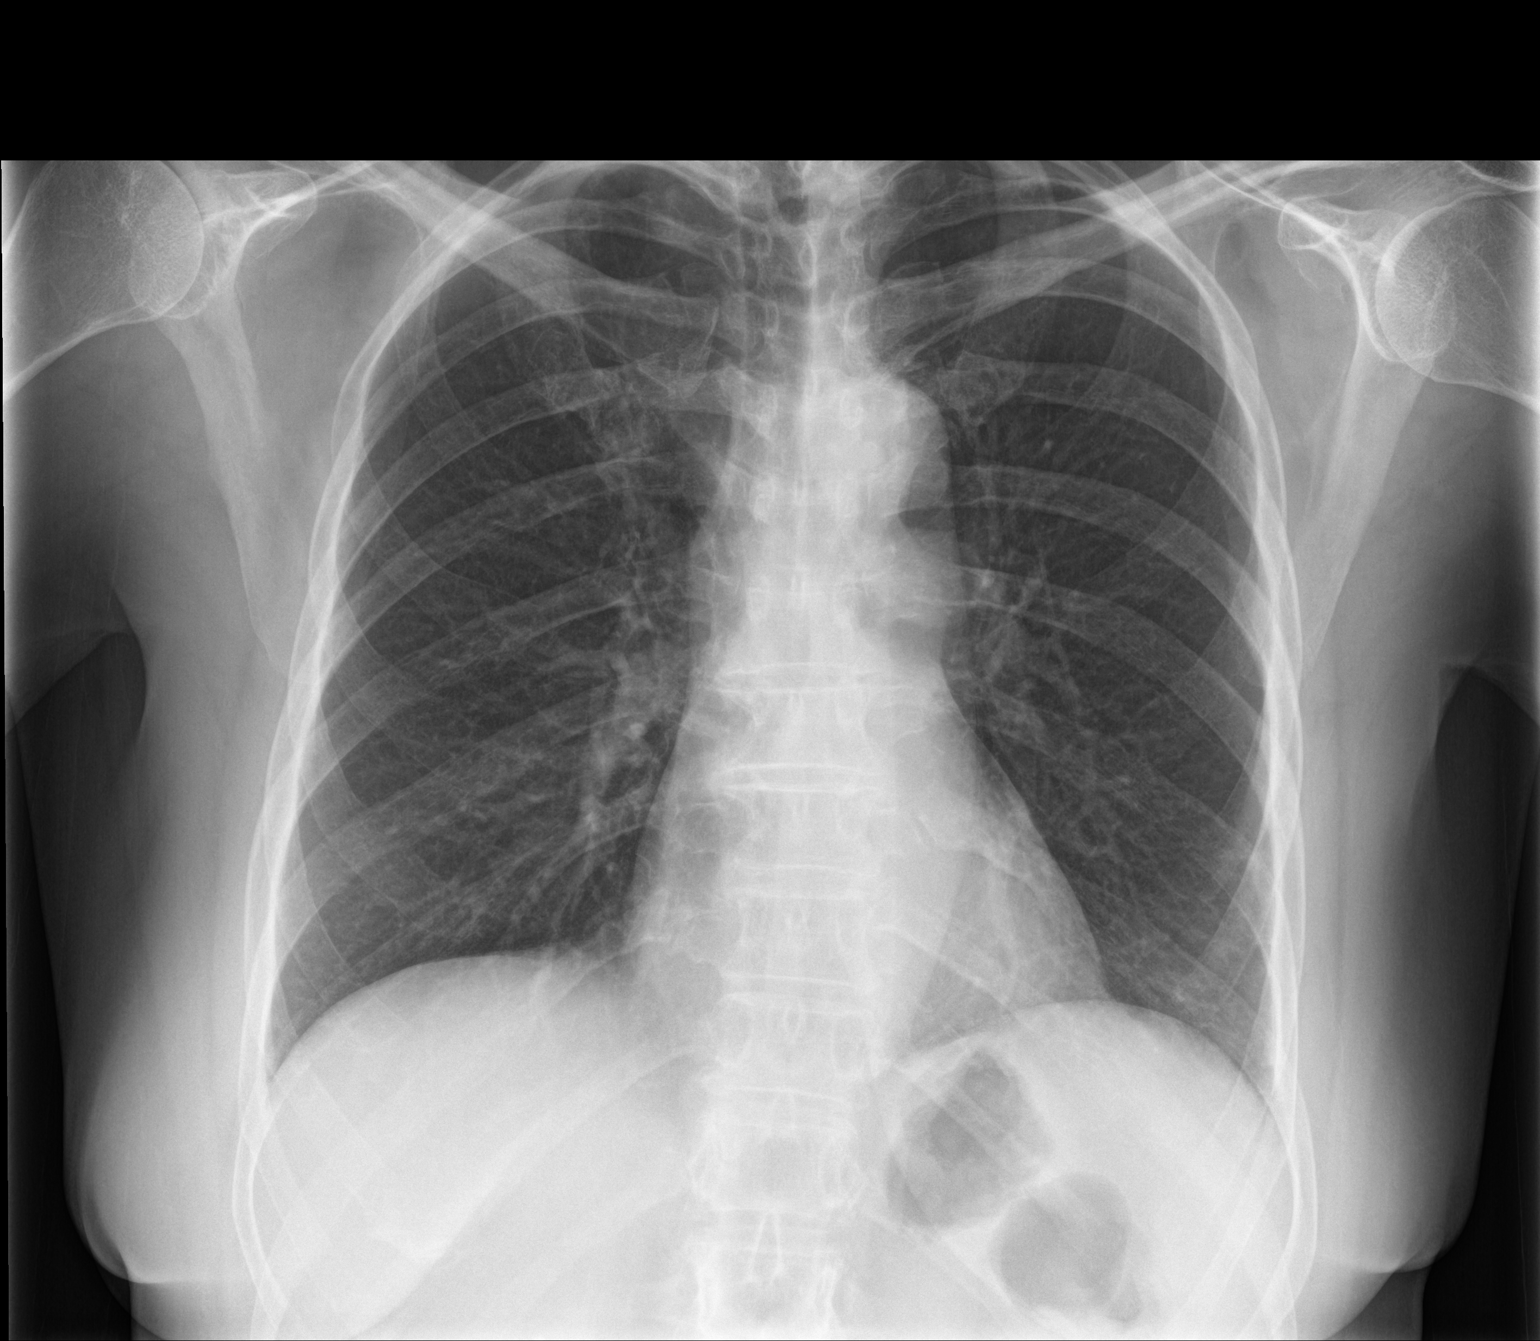

[chest lat]
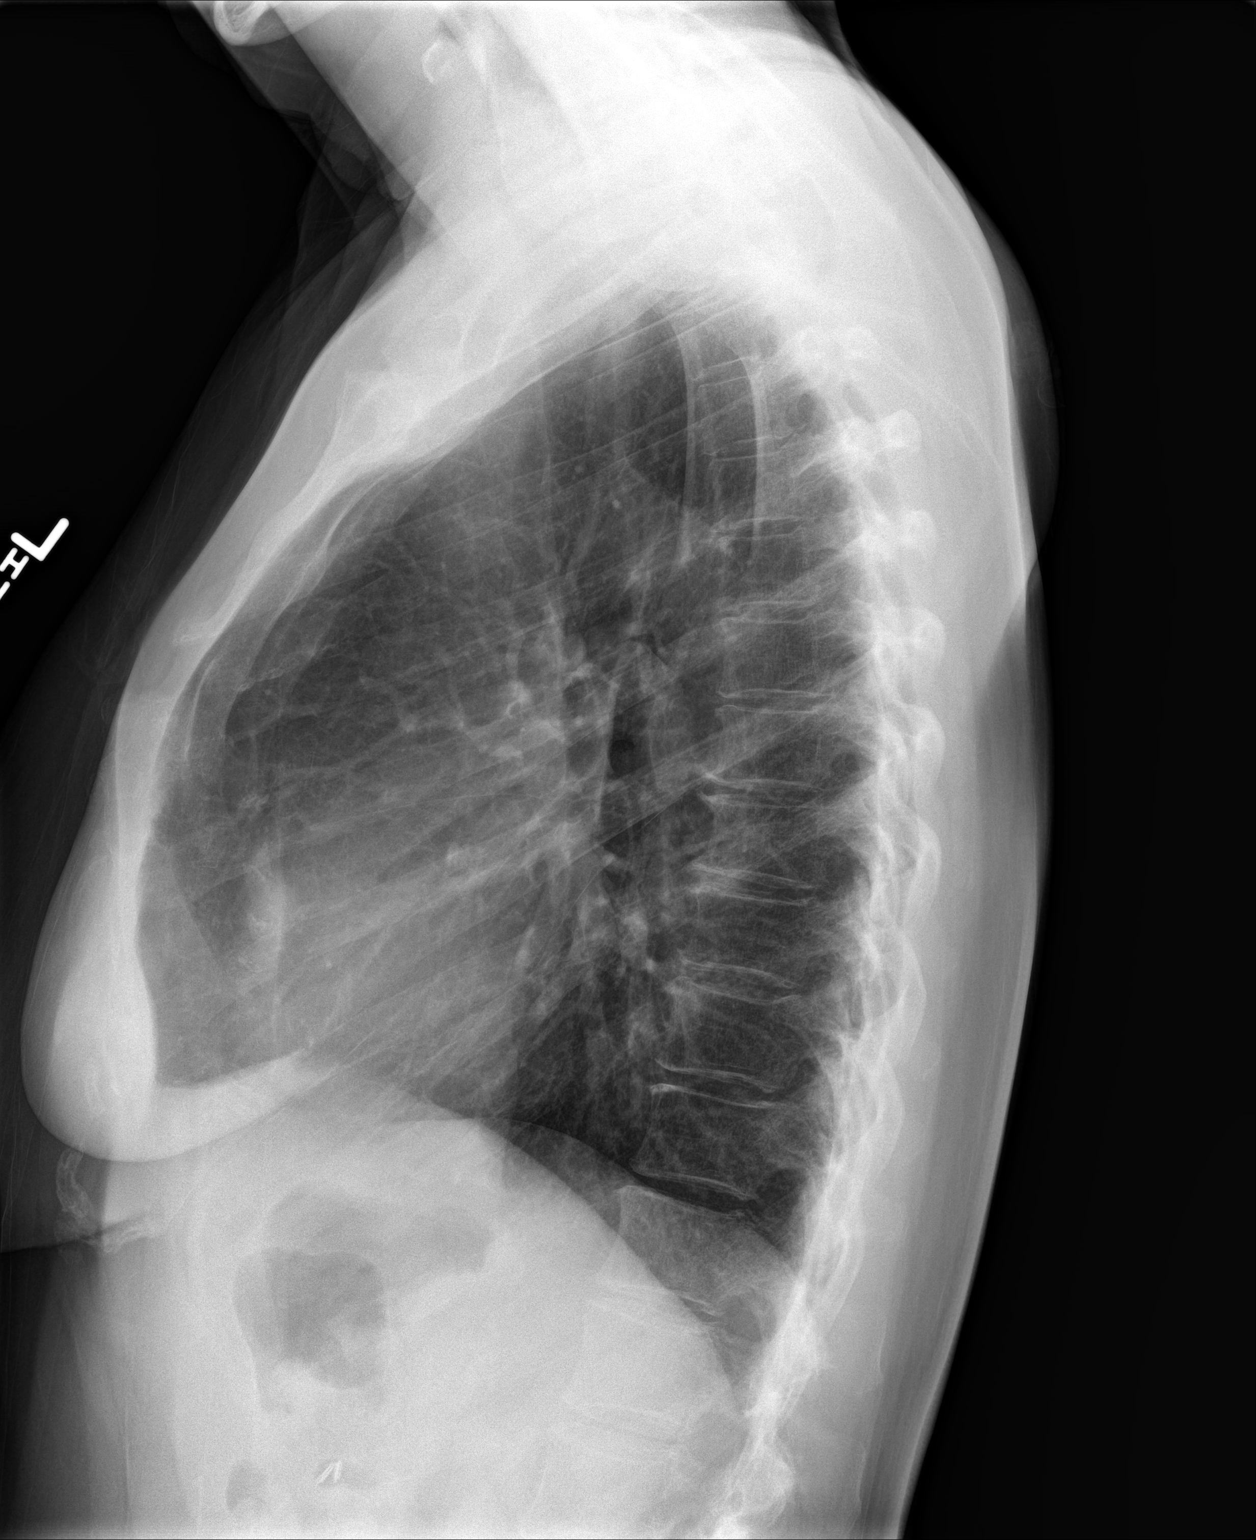

[2 of 2 positions shown; findings below may reference images not displayed]

FINDINGS: Lung volumes are normal. No consolidative airspace disease. No
pleural effusions. No pneumothorax. No pulmonary nodule or mass
noted. Pulmonary vasculature and the cardiomediastinal silhouette
are within normal limits.
IMPRESSION: No radiographic evidence of acute cardiopulmonary disease.

## 2023-09-03 LAB — COLOGUARD: COLOGUARD: POSITIVE — AB
